# Patient Record
Sex: Female | Born: 1975 | Race: Black or African American | Hispanic: No | Marital: Single | Smoking: Current every day smoker
Health system: Southern US, Community
[De-identification: ages and names within clinical notes are randomized; demographics above are authoritative.]

## PROBLEM LIST (undated history)

## (undated) DIAGNOSIS — F419 Anxiety disorder, unspecified: Secondary | ICD-10-CM

## (undated) DIAGNOSIS — F32A Depression, unspecified: Secondary | ICD-10-CM

## (undated) DIAGNOSIS — F329 Major depressive disorder, single episode, unspecified: Secondary | ICD-10-CM

## (undated) HISTORY — PX: TUBAL LIGATION: SHX77

---

## 2001-01-27 ENCOUNTER — Encounter: Payer: Self-pay | Admitting: Emergency Medicine

## 2001-01-27 ENCOUNTER — Emergency Department (HOSPITAL_COMMUNITY): Admission: EM | Admit: 2001-01-27 | Discharge: 2001-01-27 | Payer: Self-pay | Admitting: Emergency Medicine

## 2002-12-09 ENCOUNTER — Emergency Department (HOSPITAL_COMMUNITY): Admission: EM | Admit: 2002-12-09 | Discharge: 2002-12-09 | Payer: Self-pay | Admitting: Emergency Medicine

## 2004-04-03 ENCOUNTER — Emergency Department (HOSPITAL_COMMUNITY): Admission: EM | Admit: 2004-04-03 | Discharge: 2004-04-03 | Payer: Self-pay | Admitting: Emergency Medicine

## 2004-05-19 ENCOUNTER — Emergency Department (HOSPITAL_COMMUNITY): Admission: EM | Admit: 2004-05-19 | Discharge: 2004-05-19 | Payer: Self-pay | Admitting: Emergency Medicine

## 2011-11-17 ENCOUNTER — Encounter (HOSPITAL_COMMUNITY): Payer: Self-pay | Admitting: *Deleted

## 2011-11-17 ENCOUNTER — Emergency Department (HOSPITAL_COMMUNITY)
Admission: EM | Admit: 2011-11-17 | Discharge: 2011-11-18 | Disposition: A | Discharge status: Home / Self Care | Payer: Self-pay | Attending: Emergency Medicine | Admitting: Emergency Medicine

## 2011-11-17 DIAGNOSIS — X58XXXA Exposure to other specified factors, initial encounter: Secondary | ICD-10-CM | POA: Insufficient documentation

## 2011-11-17 DIAGNOSIS — Y929 Unspecified place or not applicable: Secondary | ICD-10-CM | POA: Insufficient documentation

## 2011-11-17 DIAGNOSIS — S0502XA Injury of conjunctiva and corneal abrasion without foreign body, left eye, initial encounter: Secondary | ICD-10-CM

## 2011-11-17 DIAGNOSIS — F17200 Nicotine dependence, unspecified, uncomplicated: Secondary | ICD-10-CM | POA: Insufficient documentation

## 2011-11-17 DIAGNOSIS — Y939 Activity, unspecified: Secondary | ICD-10-CM | POA: Insufficient documentation

## 2011-11-17 DIAGNOSIS — S058X9A Other injuries of unspecified eye and orbit, initial encounter: Secondary | ICD-10-CM | POA: Insufficient documentation

## 2011-11-17 NOTE — ED Provider Notes (Signed)
History     CSN: [id number]  Arrival date & time 11/17/11  2248   First MD Initiated Contact with Patient 11/17/11 2323      Chief Complaint  Patient presents with  . Eye Pain    (Consider location/radiation/quality/duration/timing/severity/associated sxs/prior treatment) HPI  Amber Bright is a 36 y.o. female complaining of right eye burning sensation occurred approximately an hour ago. She woke up. Pain did not wake her from sleep. Denies photophobia, change in vision, contact lens use or discharge. Patient does not recall any incident that would cause trauma to the eye.   History reviewed. No pertinent past medical history.  Past Surgical History  Procedure Date  . Tubal ligation     No family history on file.  History  Substance Use Topics  . Smoking status: Current Every Day Smoker  . Smokeless tobacco: Never Used  . Alcohol Use: Yes    OB History    Grav Para Term Preterm Abortions TAB SAB Ect Mult Living                  Review of Systems  Constitutional: Negative for fever.  Eyes: Positive for redness. Negative for photophobia, discharge and visual disturbance.  Respiratory: Negative for shortness of breath.   Cardiovascular: Negative for chest pain.  Gastrointestinal: Negative for nausea, vomiting, abdominal pain and diarrhea.  All other systems reviewed and are negative.    Allergies  Review of patient's allergies indicates no known allergies.  Home Medications  No current outpatient prescriptions on file.  BP 136/89  Pulse 98  Temp 98.9 F (37.2 C) (Oral)  Resp 16  Ht 5' 5.5" (1.664 m)  Wt 183 lb (83.008 kg)  BMI 29.99 kg/m2  SpO2 100%  LMP 11/02/2011  Physical Exam  Nursing note and vitals reviewed. Constitutional: She is oriented to person, place, and time. She appears well-developed and well-nourished. No distress.  HENT:  Head: Normocephalic.  Eyes: Conjunctivae normal and EOM are normal. Pupils are equal, round, and reactive  to light.         Mildly injected bulbar conjunctiva greater on right than on the left.  No foreign body on lid eversion.  Fluorescein stain reveals several small corneal abrasions.  Cardiovascular: Normal rate.   Pulmonary/Chest: Effort normal. No stridor.  Musculoskeletal: Normal range of motion.  Neurological: She is alert and oriented to person, place, and time.  Psychiatric: She has a normal mood and affect.    ED Course  Procedures (including critical care time)  Labs Reviewed - No data to display No results found.   1. Corneal abrasion, left       MDM  2 small corneal abrasions. Patient will be given Cipro ophthalmic ointment and ophthalmology follow-up.  Visual acuity is 20/40 bilaterally   Pt verbalized understanding and agrees with care plan. Outpatient follow-up and return precautions given.    New Prescriptions   CIPROFLOXACIN (CILOXAN) 0.3 % OPHTHALMIC OINTMENT    1-2 drops in affected eye every 2 hours while awake for 2 days then every 4 hours while awake for 5 days.   TRAMADOL (ULTRAM) 50 MG TABLET    Take 1 tablet (50 mg total) by mouth every 6 (six) hours as needed for pain.          Wynetta Emery, PA-C 11/18/11 0108  Wynetta Emery, PA-C 11/18/11 4098

## 2011-11-17 NOTE — ED Notes (Signed)
Pt from home with c/o right eye pain- sts she awoke from sleeping and felt like her eye was scratched on the inside.

## 2011-11-18 MED ORDER — CIPROFLOXACIN HCL 0.3 % OP OINT: TOPICAL_OINTMENT | OPHTHALMIC | Status: DC

## 2011-11-18 MED ORDER — TRAMADOL HCL 50 MG PO TABS: 50.0000 mg | ORAL_TABLET | Freq: Four times a day (QID) | ORAL | Status: DC | PRN

## 2011-11-18 NOTE — ED Provider Notes (Signed)
Medical screening examination/treatment/procedure(s) were performed by non-physician practitioner and as supervising physician I was immediately available for consultation/collaboration.   Celene Kras, MD 11/18/11 [phone]

## 2011-11-18 NOTE — ED Notes (Signed)
Patient given discharge instructions, information, prescriptions, and diet order. Patient states that they adequately understand discharge information given and to return to ED if symptoms return or worsen.     

## 2011-12-19 ENCOUNTER — Encounter (HOSPITAL_COMMUNITY): Payer: Self-pay | Admitting: *Deleted

## 2011-12-19 ENCOUNTER — Emergency Department (HOSPITAL_COMMUNITY)
Admission: EM | Admit: 2011-12-19 | Discharge: 2011-12-19 | Disposition: A | Discharge status: Home / Self Care | Payer: Self-pay | Attending: Emergency Medicine | Admitting: Emergency Medicine

## 2011-12-19 DIAGNOSIS — K0889 Other specified disorders of teeth and supporting structures: Secondary | ICD-10-CM

## 2011-12-19 DIAGNOSIS — F17200 Nicotine dependence, unspecified, uncomplicated: Secondary | ICD-10-CM | POA: Insufficient documentation

## 2011-12-19 DIAGNOSIS — K089 Disorder of teeth and supporting structures, unspecified: Secondary | ICD-10-CM | POA: Insufficient documentation

## 2011-12-19 MED ORDER — HYDROCODONE-ACETAMINOPHEN 5-325 MG PO TABS
2.0000 | ORAL_TABLET | Freq: Once | ORAL | Status: AC
  Administered 2011-12-19: 2 via ORAL
  Filled 2011-12-19: qty 2

## 2011-12-19 MED ORDER — AMOXICILLIN 500 MG PO CAPS: 500.0000 mg | ORAL_CAPSULE | Freq: Three times a day (TID) | ORAL | Status: DC

## 2011-12-19 MED ORDER — HYDROCODONE-ACETAMINOPHEN 5-325 MG PO TABS: 2.0000 | ORAL_TABLET | ORAL | Status: DC | PRN

## 2011-12-19 NOTE — ED Provider Notes (Signed)
History     CSN: [id number]  Arrival date & time 12/19/11  1515   First MD Initiated Contact with Patient 12/19/11 1615      Chief Complaint  Patient presents with  . Dental Pain    (Consider location/radiation/quality/duration/timing/severity/associated sxs/prior treatment) Patient is a 36 y.o. female presenting with tooth pain. The history is provided by the patient. No language interpreter was used.  Dental PainThe primary symptoms include mouth pain. The symptoms began more than 1 month ago. The symptoms are worsening. The symptoms are new. The symptoms occur constantly.  Mouth pain began more than 1 month ago. Mouth pain occurs constantly. Mouth pain is worsening. Affected locations include: gum(s) and teeth. At its highest the mouth pain was at 10/10. The mouth pain is currently at 10/10.  Additional symptoms include: gum swelling and gum tenderness. Medical issues do not include: alcohol problem.   Pt complains of pain to right upper molar.   Pt reports no relief with otc medications History reviewed. No pertinent past medical history.  Past Surgical History  Procedure Date  . Tubal ligation     History reviewed. No pertinent family history.  History  Substance Use Topics  . Smoking status: Current Every Day Smoker  . Smokeless tobacco: Never Used  . Alcohol Use: Yes    OB History    Grav Para Term Preterm Abortions TAB SAB Ect Mult Living                  Review of Systems  HENT: Positive for dental problem.   All other systems reviewed and are negative.    Allergies  Review of patient's allergies indicates no known allergies.  Home Medications   Current Outpatient Rx  Name  Route  Sig  Dispense  Refill  . IBUPROFEN 200 MG PO TABS   Oral   Take 800 mg by mouth every 6 (six) hours as needed. For pain         . TRAMADOL HCL 50 MG PO TABS   Oral   Take 1 tablet (50 mg total) by mouth every 6 (six) hours as needed for pain.   10 tablet   0      BP 126/92  Pulse 101  Temp 98.3 F (36.8 C) (Oral)  Resp 14  SpO2 99%  Physical Exam  Nursing note and vitals reviewed. Constitutional: She is oriented to person, place, and time. She appears well-developed and well-nourished.  HENT:  Head: Normocephalic and atraumatic.  Right Ear: External ear normal.  Left Ear: External ear normal.  Nose: Nose normal.  Mouth/Throat: Oropharynx is clear and moist.  Eyes: Pupils are equal, round, and reactive to light.  Neck: Normal range of motion.  Cardiovascular: Normal rate and normal heart sounds.   Pulmonary/Chest: Effort normal and breath sounds normal.  Musculoskeletal: Normal range of motion.  Neurological: She is alert and oriented to person, place, and time.  Skin: Skin is warm.  Psychiatric: She has a normal mood and affect.    ED Course  Procedures (including critical care time)  Labs Reviewed - No data to display No results found.   1. Toothache       MDM  Pt given rx for amoxicillian and hydrocodone.   Pt given number for Dr. Oswaldo Done dentistry.        Lonia Skinner [place], [place] 12/19/11 1625  Lonia Skinner [place], [place] 12/19/11 [phone]

## 2011-12-19 NOTE — ED Notes (Signed)
Patient has 10/10 right upper tooth pain. Patient has been treating at home with tylenol, oral jel, NSaids, Tramadol, clove oil with minor relief. Today it became more intense. Patient does not have a dentist or primary care physician.

## 2011-12-19 NOTE — ED Provider Notes (Signed)
Medical screening examination/treatment/procedure(s) were performed by non-physician practitioner and as supervising physician I was immediately available for consultation/collaboration.  Doug Sou, MD 12/19/11 1904

## 2011-12-19 NOTE — ED Notes (Signed)
To ED for eval of right upper tooth pain since yesterday. States she has used OTC meds without relief.

## 2012-03-30 ENCOUNTER — Encounter (HOSPITAL_COMMUNITY): Payer: Self-pay | Admitting: Emergency Medicine

## 2012-03-30 ENCOUNTER — Emergency Department (HOSPITAL_COMMUNITY)
Admission: EM | Admit: 2012-03-30 | Discharge: 2012-03-30 | Disposition: A | Discharge status: Home / Self Care | Payer: Self-pay | Attending: Emergency Medicine | Admitting: Emergency Medicine

## 2012-03-30 DIAGNOSIS — Z3202 Encounter for pregnancy test, result negative: Secondary | ICD-10-CM | POA: Insufficient documentation

## 2012-03-30 DIAGNOSIS — Z9851 Tubal ligation status: Secondary | ICD-10-CM | POA: Insufficient documentation

## 2012-03-30 DIAGNOSIS — R1110 Vomiting, unspecified: Secondary | ICD-10-CM | POA: Insufficient documentation

## 2012-03-30 DIAGNOSIS — E669 Obesity, unspecified: Secondary | ICD-10-CM | POA: Insufficient documentation

## 2012-03-30 DIAGNOSIS — M545 Low back pain, unspecified: Secondary | ICD-10-CM | POA: Insufficient documentation

## 2012-03-30 DIAGNOSIS — F17200 Nicotine dependence, unspecified, uncomplicated: Secondary | ICD-10-CM | POA: Insufficient documentation

## 2012-03-30 DIAGNOSIS — R109 Unspecified abdominal pain: Secondary | ICD-10-CM | POA: Insufficient documentation

## 2012-03-30 LAB — COMPREHENSIVE METABOLIC PANEL
ALT: 12 U/L (ref 0–35)
AST: 16 U/L (ref 0–37)
Calcium: 8.9 mg/dL (ref 8.4–10.5)
Sodium: 133 mEq/L — ABNORMAL LOW (ref 135–145)
Total Protein: 7.7 g/dL (ref 6.0–8.3)

## 2012-03-30 LAB — URINALYSIS, ROUTINE W REFLEX MICROSCOPIC
Glucose, UA: NEGATIVE mg/dL
Hgb urine dipstick: NEGATIVE
Protein, ur: NEGATIVE mg/dL

## 2012-03-30 LAB — CBC WITH DIFFERENTIAL/PLATELET
Basophils Relative: 0 % (ref 0–1)
Eosinophils Absolute: 0.1 10*3/uL (ref 0.0–0.7)
HCT: 43 % (ref 36.0–46.0)
Hemoglobin: 14.9 g/dL (ref 12.0–15.0)
MCH: 32.3 pg (ref 26.0–34.0)
MCHC: 34.7 g/dL (ref 30.0–36.0)
Monocytes Absolute: 0.5 10*3/uL (ref 0.1–1.0)
Monocytes Relative: 6 % (ref 3–12)
Neutro Abs: 8.2 10*3/uL — ABNORMAL HIGH (ref 1.7–7.7)

## 2012-03-30 LAB — PREGNANCY, URINE: Preg Test, Ur: NEGATIVE

## 2012-03-30 MED ORDER — MORPHINE SULFATE 4 MG/ML IJ SOLN
4.0000 mg | INTRAMUSCULAR | Status: DC | PRN
  Administered 2012-03-30: 4 mg via INTRAVENOUS
  Filled 2012-03-30: qty 1

## 2012-03-30 MED ORDER — FENTANYL CITRATE 0.05 MG/ML IJ SOLN
50.0000 ug | Freq: Once | INTRAMUSCULAR | Status: AC
  Administered 2012-03-30: 50 ug via INTRAVENOUS
  Filled 2012-03-30: qty 2

## 2012-03-30 MED ORDER — ONDANSETRON 8 MG PO TBDP
8.0000 mg | ORAL_TABLET | Freq: Once | ORAL | Status: AC
  Administered 2012-03-30: 8 mg via ORAL
  Filled 2012-03-30: qty 1

## 2012-03-30 MED ORDER — DICYCLOMINE HCL 10 MG/ML IM SOLN
20.0000 mg | Freq: Once | INTRAMUSCULAR | Status: AC
  Administered 2012-03-30: 20 mg via INTRAMUSCULAR
  Filled 2012-03-30: qty 2

## 2012-03-30 MED ORDER — FAMOTIDINE IN NACL 20-0.9 MG/50ML-% IV SOLN
20.0000 mg | Freq: Once | INTRAVENOUS | Status: AC
  Administered 2012-03-30: 20 mg via INTRAVENOUS
  Filled 2012-03-30: qty 50

## 2012-03-30 MED ORDER — PROMETHAZINE HCL 25 MG PO TABS: 25.0000 mg | ORAL_TABLET | Freq: Four times a day (QID) | ORAL | Status: DC | PRN

## 2012-03-30 MED ORDER — ONDANSETRON HCL 4 MG/2ML IJ SOLN
4.0000 mg | Freq: Once | INTRAMUSCULAR | Status: AC
  Administered 2012-03-30: 4 mg via INTRAVENOUS
  Filled 2012-03-30: qty 2

## 2012-03-30 MED ORDER — TRAMADOL HCL 50 MG PO TABS: 50.0000 mg | ORAL_TABLET | Freq: Four times a day (QID) | ORAL | Status: DC | PRN

## 2012-03-30 MED ORDER — OXYCODONE-ACETAMINOPHEN 5-325 MG PO TABS: ORAL_TABLET | ORAL | Status: DC

## 2012-03-30 NOTE — ED Notes (Signed)
Pt c/o vomiting x 6 hours, abd cramping, HA. Denies diarrhea.

## 2012-03-30 NOTE — ED Provider Notes (Signed)
History     CSN: [id number]  Arrival date & time 03/30/12  0155   First MD Initiated Contact with Patient 03/30/12 [phone]      Chief Complaint  Patient presents with  . Emesis    (Consider location/radiation/quality/duration/timing/severity/associated sxs/prior treatment) HPI   Amber Bright is a 37 y.o. female with multiple episodes of nonbloody nonbilious, non-coffee ground emesis since 8 PM last night. Patient reports a moderate abdominal and low back pain that began after the emesis. She denies fever, diarrhea, sick contacts, cough, shortness of breath or chest pain.  History reviewed. No pertinent past medical history.  Past Surgical History  Procedure Laterality Date  . Tubal ligation      No family history on file.  History  Substance Use Topics  . Smoking status: Current Every Day Smoker  . Smokeless tobacco: Never Used  . Alcohol Use: Yes     Comment: occ    OB History   Grav Para Term Preterm Abortions TAB SAB Ect Mult Living                  Review of Systems  Constitutional: Negative for fever.  Respiratory: Negative for shortness of breath.   Cardiovascular: Negative for chest pain.  Gastrointestinal: Positive for nausea, vomiting and abdominal pain. Negative for diarrhea.  Musculoskeletal: Positive for back pain.  All other systems reviewed and are negative.    Allergies  Hydrocodone-acetaminophen  Home Medications   Current Outpatient Rx  Name  Route  Sig  Dispense  Refill  . diphenhydramine-acetaminophen (TYLENOL PM) 25-500 MG TABS   Oral   Take 1-2 tablets by mouth at bedtime as needed (for pain).         Marland Kitchen docusate sodium (COLACE) 100 MG capsule   Oral   Take 100 mg by mouth 2 (two) times daily as needed for constipation.         . senna (SENOKOT) 8.6 MG TABS   Oral   Take 1 tablet by mouth daily as needed (for constipation).           BP 140/94  Pulse 89  Temp(Src) 99.1 F (37.3 C) (Oral)  Resp 18  Ht 5\' 5"  (1.651  m)  Wt 170 lb (77.111 kg)  BMI 28.29 kg/m2  SpO2 100%  LMP 03/17/2012  Physical Exam  Nursing note and vitals reviewed. Constitutional: She is oriented to person, place, and time. She appears well-developed and well-nourished. No distress.  Obese  HENT:  Head: Normocephalic.  Mouth/Throat: Oropharynx is clear and moist.  MMM  Eyes: Conjunctivae and EOM are normal. Pupils are equal, round, and reactive to light.  Neck: Normal range of motion.  Cardiovascular: Normal rate, regular rhythm and intact distal pulses.   Pulmonary/Chest: Effort normal and breath sounds normal. No stridor. No respiratory distress. She has no wheezes. She has no rales. She exhibits no tenderness.  Abdominal: Soft. Bowel sounds are normal. She exhibits no distension and no mass. There is no tenderness. There is no rebound and no guarding.  Musculoskeletal: Normal range of motion.  A tenderness to palpation of low back  Neurological: She is alert and oriented to person, place, and time.  Psychiatric: She has a normal mood and affect.    ED Course  Procedures (including critical care time)  Labs Reviewed  COMPREHENSIVE METABOLIC PANEL - Abnormal; Notable for the following:    Sodium 133 (*)    Glucose, Bld 141 (*)    All other components within  normal limits  CBC WITH DIFFERENTIAL - Abnormal; Notable for the following:    Neutrophils Relative 86 (*)    Neutro Abs 8.2 (*)    Lymphocytes Relative 8 (*)    All other components within normal limits  URINALYSIS, ROUTINE W REFLEX MICROSCOPIC - Abnormal; Notable for the following:    Color, Urine AMBER (*)    APPearance CLOUDY (*)    Specific Gravity, Urine 1.034 (*)    All other components within normal limits  LIPASE, BLOOD  PREGNANCY, URINE   No results found.   1. Emesis       MDM   Amber Bright is a 37 y.o. female with multiple episodes of nausea and vomiting. Blood work is unremarkable and abdominal exam is nonsurgical. Patient passed by  mouth challenge. Pain control given. Return precautions explained with patient voicing her understanding.   Filed Vitals:   03/30/12 0306 03/30/12 0807 03/30/12 0957  BP: 132/84 140/94 141/85  Pulse: 99 89 86  Temp: 98.7 F (37.1 C) 99.1 F (37.3 C) 99 F (37.2 C)  TempSrc: Oral Oral Oral  Resp: 18 18 18   Height: 5\' 5"  (1.651 m)    Weight: 170 lb (77.111 kg)    SpO2: 98% 100% 98%     Pt verbalized understanding and agrees with care plan. Outpatient follow-up and return precautions given.    Discharge Medication List as of 03/30/2012  9:44 AM    START taking these medications   Details  promethazine (PHENERGAN) 25 MG tablet Take 1 tablet (25 mg total) by mouth every 6 (six) hours as needed for nausea., Starting 03/30/2012, Until Discontinued, Print    traMADol (ULTRAM) 50 MG tablet Take 1 tablet (50 mg total) by mouth every 6 (six) hours as needed for pain., Starting 03/30/2012, Until Discontinued, [employer], PA-C 03/30/12 1642

## 2012-03-31 NOTE — ED Provider Notes (Signed)
Medical screening examination/treatment/procedure(s) were performed by non-physician practitioner and as supervising physician I was immediately available for consultation/collaboration.  Kejon Feild T Romey Mathieson, MD 03/31/12 2031 

## 2012-07-15 ENCOUNTER — Encounter (HOSPITAL_COMMUNITY): Payer: Self-pay | Admitting: *Deleted

## 2012-07-15 ENCOUNTER — Emergency Department (HOSPITAL_COMMUNITY): Payer: Self-pay

## 2012-07-15 ENCOUNTER — Emergency Department (HOSPITAL_COMMUNITY)
Admission: EM | Admit: 2012-07-15 | Discharge: 2012-07-15 | Disposition: A | Discharge status: Home / Self Care | Payer: Self-pay | Attending: Emergency Medicine | Admitting: Emergency Medicine

## 2012-07-15 DIAGNOSIS — F17200 Nicotine dependence, unspecified, uncomplicated: Secondary | ICD-10-CM | POA: Insufficient documentation

## 2012-07-15 DIAGNOSIS — Z79899 Other long term (current) drug therapy: Secondary | ICD-10-CM | POA: Insufficient documentation

## 2012-07-15 DIAGNOSIS — X500XXA Overexertion from strenuous movement or load, initial encounter: Secondary | ICD-10-CM | POA: Insufficient documentation

## 2012-07-15 DIAGNOSIS — S8392XA Sprain of unspecified site of left knee, initial encounter: Secondary | ICD-10-CM

## 2012-07-15 DIAGNOSIS — IMO0002 Reserved for concepts with insufficient information to code with codable children: Secondary | ICD-10-CM | POA: Insufficient documentation

## 2012-07-15 DIAGNOSIS — Y9301 Activity, walking, marching and hiking: Secondary | ICD-10-CM | POA: Insufficient documentation

## 2012-07-15 DIAGNOSIS — Y9289 Other specified places as the place of occurrence of the external cause: Secondary | ICD-10-CM | POA: Insufficient documentation

## 2012-07-15 MED ORDER — OXYCODONE-ACETAMINOPHEN 5-325 MG PO TABS: 1.0000 | ORAL_TABLET | ORAL | Status: DC | PRN

## 2012-07-15 MED ORDER — OXYCODONE-ACETAMINOPHEN 5-325 MG PO TABS
1.0000 | ORAL_TABLET | Freq: Once | ORAL | Status: AC
  Administered 2012-07-15: 1 via ORAL
  Filled 2012-07-15: qty 1

## 2012-07-15 NOTE — ED Notes (Signed)
Pt states twisted left knee tonight when stepping off porch; pain with ambulation

## 2012-07-15 NOTE — ED Provider Notes (Signed)
Medical screening examination/treatment/procedure(s) were performed by non-physician practitioner and as supervising physician I was immediately available for consultation/collaboration.  Teddy Rebstock M Jekhi Bolin, MD 07/15/12 0628 

## 2012-07-15 NOTE — ED Provider Notes (Signed)
History    CSN: [id number] Arrival date & time 07/15/12  0327  First MD Initiated Contact with Patient 07/15/12 0503     Chief Complaint  Patient presents with  . Knee Pain   (Consider location/radiation/quality/duration/timing/severity/associated sxs/prior Treatment) HPI History provided by pt.   Pt stepped down wrong when coming down the stairs this morning and her L knee twisted and popped. She has had severe pain ever since and is unable to bear weight.  Aggravated by flexion and no relief w/ ibuprofen or aleve. No associated paresthesias.   History reviewed. No pertinent past medical history. Past Surgical History  Procedure Laterality Date  . Tubal ligation     No family history on file. History  Substance Use Topics  . Smoking status: Current Every Day Smoker  . Smokeless tobacco: Never Used  . Alcohol Use: Yes     Comment: occ   OB History   Grav Para Term Preterm Abortions TAB SAB Ect Mult Living                 Review of Systems  All other systems reviewed and are negative.    Allergies  Hydrocodone-acetaminophen  Home Medications   Current Outpatient Rx  Name  Route  Sig  Dispense  Refill  . ibuprofen (ADVIL,MOTRIN) 200 MG tablet   Oral   Take 200 mg by mouth every 6 (six) hours as needed for pain (pain and swelling).         . ranitidine (ZANTAC) 150 MG tablet   Oral   Take 150 mg by mouth 2 (two) times daily.         Marland Kitchen docusate sodium (COLACE) 100 MG capsule   Oral   Take 100 mg by mouth 2 (two) times daily as needed for constipation.         Marland Kitchen oxyCODONE-acetaminophen (PERCOCET/ROXICET) 5-325 MG per tablet   Oral   Take 1 tablet by mouth every 4 (four) hours as needed for pain.   12 tablet   0    BP 137/98  Pulse 85  Temp(Src) 98.4 F (36.9 C) (Oral)  Resp 20  SpO2 99%  LMP 07/09/2012 Physical Exam  Nursing note and vitals reviewed. Constitutional: She is oriented to person, place, and time. She appears well-developed and  well-nourished. No distress.  HENT:  Head: Normocephalic and atraumatic.  Eyes:  Normal appearance  Neck: Normal range of motion.  Pulmonary/Chest: Effort normal.  Musculoskeletal: Normal range of motion.  L knee w/out deformity, ecchymosis or obvious edema.  Diffuse tenderness.  Pain w/ passive flexion.  No pain in knee w/ ROM of ankle.  2+ DP pulse and distal sensation intact.    Neurological: She is alert and oriented to person, place, and time.  Psychiatric: She has a normal mood and affect. Her behavior is normal.    ED Course  Procedures (including critical care time) Labs Reviewed - No data to display Dg Knee Complete 4 Views Left  07/15/2012   *RADIOLOGY REPORT*  Clinical Data: Twisting injury.  Pain, swelling.  LEFT KNEE - COMPLETE 4+ VIEW  Comparison: None.  Findings: No acute bony abnormality.  Specifically, no fracture, subluxation, or dislocation.  Soft tissues are intact. Joint spaces are maintained.  Normal bone mineralization.  No joint effusion.  IMPRESSION: Negative.   Original Report Authenticated By: Charlett Nose, M.D.   1. Sprain of left knee, initial encounter     MDM  [phone] F presents w/ L knee injury.  Xray neg for fx/dislocation and no NV deficits on exam.  Nursing staff provided w/ knee immobilizer and crutches.  Pt received one percocet in ED and d/c'd home w/ 12 more.  She is allergic to vicodin.  Recommended NSAID and RICE and referred to ortho for persistent/worsening sx.    Otilio Miu, PA-C 07/15/12 [phone]

## 2012-12-22 ENCOUNTER — Emergency Department (HOSPITAL_COMMUNITY)
Admission: EM | Admit: 2012-12-22 | Discharge: 2012-12-22 | Disposition: A | Discharge status: Home / Self Care | Payer: Self-pay | Attending: Emergency Medicine | Admitting: Emergency Medicine

## 2012-12-22 ENCOUNTER — Emergency Department (HOSPITAL_COMMUNITY): Payer: Self-pay

## 2012-12-22 ENCOUNTER — Encounter (HOSPITAL_COMMUNITY): Payer: Self-pay | Admitting: Emergency Medicine

## 2012-12-22 DIAGNOSIS — F17200 Nicotine dependence, unspecified, uncomplicated: Secondary | ICD-10-CM | POA: Insufficient documentation

## 2012-12-22 DIAGNOSIS — J209 Acute bronchitis, unspecified: Secondary | ICD-10-CM | POA: Insufficient documentation

## 2012-12-22 DIAGNOSIS — Z79899 Other long term (current) drug therapy: Secondary | ICD-10-CM | POA: Insufficient documentation

## 2012-12-22 DIAGNOSIS — R51 Headache: Secondary | ICD-10-CM | POA: Insufficient documentation

## 2012-12-22 DIAGNOSIS — IMO0001 Reserved for inherently not codable concepts without codable children: Secondary | ICD-10-CM | POA: Insufficient documentation

## 2012-12-22 DIAGNOSIS — J40 Bronchitis, not specified as acute or chronic: Secondary | ICD-10-CM

## 2012-12-22 LAB — BASIC METABOLIC PANEL
BUN: 17 mg/dL (ref 6–23)
CO2: 25 mEq/L (ref 19–32)
Chloride: 104 mEq/L (ref 96–112)
GFR calc non Af Amer: 80 mL/min — ABNORMAL LOW (ref >90)
Glucose, Bld: 98 mg/dL (ref 70–99)
Potassium: 3.3 mEq/L — ABNORMAL LOW (ref 3.5–5.1)

## 2012-12-22 LAB — CBC
HCT: 39 % (ref 36.0–46.0)
Hemoglobin: 13.8 g/dL (ref 12.0–15.0)
MCHC: 35.4 g/dL (ref 30.0–36.0)
Platelets: 360 10*3/uL (ref 150–400)
RBC: 4.26 MIL/uL (ref 3.87–5.11)

## 2012-12-22 LAB — POCT I-STAT TROPONIN I: Troponin i, poc: 0 ng/mL (ref 0.00–0.08)

## 2012-12-22 MED ORDER — AZITHROMYCIN 250 MG PO TABS: 250.0000 mg | ORAL_TABLET | Freq: Every day | ORAL | Status: DC

## 2012-12-22 MED ORDER — ALBUTEROL SULFATE HFA 108 (90 BASE) MCG/ACT IN AERS
2.0000 | INHALATION_SPRAY | RESPIRATORY_TRACT | Status: DC | PRN
  Administered 2012-12-22: 2 via RESPIRATORY_TRACT
  Filled 2012-12-22: qty 6.7

## 2012-12-22 MED ORDER — GUAIFENESIN-CODEINE 100-10 MG/5ML PO SOLN: 10.0000 mL | Freq: Once | ORAL | Status: DC

## 2012-12-22 MED ORDER — GUAIFENESIN 100 MG/5ML PO LIQD: 100.0000 mg | ORAL | Status: DC | PRN

## 2012-12-22 MED ORDER — AZITHROMYCIN 250 MG PO TABS
500.0000 mg | ORAL_TABLET | Freq: Once | ORAL | Status: AC
  Administered 2012-12-22: 500 mg via ORAL
  Filled 2012-12-22: qty 2

## 2012-12-22 MED ORDER — ALBUTEROL SULFATE (5 MG/ML) 0.5% IN NEBU
2.5000 mg | INHALATION_SOLUTION | Freq: Once | RESPIRATORY_TRACT | Status: AC
  Administered 2012-12-22: 2.5 mg via RESPIRATORY_TRACT
  Filled 2012-12-22: qty 0.5

## 2012-12-22 NOTE — ED Notes (Signed)
Patient reports a non productive cough, sore throat, wheezing, SOB x 2 weeks.

## 2012-12-22 NOTE — ED Provider Notes (Signed)
CSN: [id number]     Arrival date & time 12/22/12  1748 History   First MD Initiated Contact with Patient 12/22/12 2037     Chief Complaint  Patient presents with  . Cough  . Chest Pain   (Consider location/radiation/quality/duration/timing/severity/associated sxs/prior Treatment) Patient is a 37 y.o. female presenting with cough.  Cough Cough characteristics:  Hoarse Severity:  Moderate Onset quality:  Gradual Duration:  4 days Timing:  Intermittent Progression:  Unchanged Chronicity:  New Smoker: yes   Context: sick contacts and smoke exposure   Relieved by:  Nothing Worsened by:  Nothing tried Ineffective treatments:  None tried Associated symptoms: chest pain, headaches, myalgias and wheezing   Associated symptoms: no ear pain, no eye discharge, no fever, no shortness of breath, no sore throat and no weight loss   Risk factors: no recent infection and no recent travel     Amber Bright is a 37 y.o.female without any significant PMH presents to the ER with complaints of cough and cold for 4 days with loss of voice. She is normally healthy and has not had fevers. She states that she lost her voice and now when she coughs it hurts her shoulder, head, and chest. She feels that she is not sleeping because she is coughing and she would like her voice to come back.   History reviewed. No pertinent past medical history. Past Surgical History  Procedure Laterality Date  . Tubal ligation     History reviewed. No pertinent family history. History  Substance Use Topics  . Smoking status: Current Every Day Smoker -- 0.50 packs/day    Types: Cigarettes  . Smokeless tobacco: Never Used  . Alcohol Use: Yes     Comment: occ   OB History   Grav Para Term Preterm Abortions TAB SAB Ect Mult Living                 Review of Systems  Constitutional: Negative for fever and weight loss.  HENT: Negative for ear pain and sore throat.   Eyes: Negative for discharge.  Respiratory:  Positive for cough and wheezing. Negative for shortness of breath.   Cardiovascular: Positive for chest pain.  Musculoskeletal: Positive for myalgias.  Neurological: Positive for headaches.    Allergies  Hydrocodone-acetaminophen  Home Medications   Current Outpatient Rx  Name  Route  Sig  Dispense  Refill  . aspirin-sod bicarb-citric acid (ALKA-SELTZER) 325 MG TBEF tablet   Oral   Take 650 mg by mouth every 6 (six) hours as needed (for chest tightness).         Marland Kitchen ibuprofen (ADVIL,MOTRIN) 200 MG tablet   Oral   Take 200 mg by mouth every 6 (six) hours as needed for pain (pain and swelling).         . ranitidine (ZANTAC) 150 MG tablet   Oral   Take 150 mg by mouth 2 (two) times daily.         Marland Kitchen azithromycin (ZITHROMAX) 250 MG tablet   Oral   Take 1 tablet (250 mg total) by mouth daily. 1 tab PO q daily   4 tablet   0   . guaiFENesin (ROBITUSSIN) 100 MG/5ML liquid   Oral   Take 5-10 mLs (100-200 mg total) by mouth every 4 (four) hours as needed for cough.   60 mL   0    BP 149/92  Pulse 91  Temp(Src) 98.7 F (37.1 C) (Oral)  Resp 16  SpO2 100%  LMP  12/22/2012 Physical Exam  Nursing note and vitals reviewed. Constitutional: She appears well-developed and well-nourished. No distress.  HENT:  Head: Normocephalic and atraumatic.  Right Ear: Tympanic membrane normal.  Left Ear: Tympanic membrane normal.  Nose: Right sinus exhibits maxillary sinus tenderness and frontal sinus tenderness. Left sinus exhibits maxillary sinus tenderness and frontal sinus tenderness.  Mouth/Throat: Uvula is midline and oropharynx is clear and moist.  Eyes: Conjunctivae are normal. Pupils are equal, round, and reactive to light.  Neck: Normal range of motion. Neck supple.  Cardiovascular: Normal rate and regular rhythm.   Pulmonary/Chest: Effort normal. She has no decreased breath sounds. She has wheezes. She has no rhonchi.  Abdominal: Soft.  Neurological: She is alert.  Skin:  Skin is warm and dry.    ED Course  Procedures (including critical care time) Labs Review Labs Reviewed  CBC  PRO B NATRIURETIC PEPTIDE  BASIC METABOLIC PANEL  POCT I-STAT TROPONIN I   Imaging Review Dg Chest 2 View  12/22/2012   CLINICAL DATA:  Cough, chest pain  EXAM: CHEST  2 VIEW  COMPARISON:  None.  FINDINGS: Normal mediastinum and cardiac silhouette. Normal pulmonary vasculature. No evidence of effusion, infiltrate, or pneumothorax. No acute bony abnormality.  IMPRESSION: Normal chest radiograph   Electronically Signed   By: Genevive Bi M.D.   On: 12/22/2012 19:42    EKG Interpretation   None       MDM   1. Bronchitis     Patients labs are not impressive for any abnormalities. I do not believe that this is cardiac due to patients loss of voice and coughing.   Breathing treatment giving in triage which helped wheezing.  Rx: Azithromycin, Albuterol and cough medication.  Non toxic, pt comfortable with plan.  37 y.o.Amber Bright's evaluation in the Emergency Department is complete. It has been determined that no acute conditions requiring further emergency intervention are present at this time. The patient/guardian have been advised of the diagnosis and plan. We have discussed signs and symptoms that warrant return to the ED, such as changes or worsening in symptoms.  Vital signs are stable at discharge. Filed Vitals:   12/22/12 1911  BP: 149/92  Pulse: 91  Temp:   Resp: 16    Patient/guardian has voiced understanding and agreed to follow-up with the PCP or specialist.     Dorthula Matas, PA-C 12/22/12 2121

## 2012-12-22 NOTE — ED Notes (Signed)
Pt reports R shoulder pain and R side pain, only when coughing at this time.

## 2012-12-23 NOTE — ED Provider Notes (Signed)
Medical screening examination/treatment/procedure(s) were performed by non-physician practitioner and as supervising physician I was immediately available for consultation/collaboration.  EKG Interpretation   None        Courtney F Horton, MD 12/23/12 1635 

## 2013-05-05 ENCOUNTER — Emergency Department (HOSPITAL_COMMUNITY)
Admission: EM | Admit: 2013-05-05 | Discharge: 2013-05-05 | Disposition: A | Discharge status: Home / Self Care | Payer: No Typology Code available for payment source | Attending: Emergency Medicine | Admitting: Emergency Medicine

## 2013-05-05 DIAGNOSIS — L723 Sebaceous cyst: Secondary | ICD-10-CM | POA: Insufficient documentation

## 2013-05-05 DIAGNOSIS — F17200 Nicotine dependence, unspecified, uncomplicated: Secondary | ICD-10-CM | POA: Insufficient documentation

## 2013-05-05 DIAGNOSIS — Z79899 Other long term (current) drug therapy: Secondary | ICD-10-CM | POA: Insufficient documentation

## 2013-05-05 MED ORDER — OXYCODONE-ACETAMINOPHEN 5-325 MG PO TABS
1.0000 | ORAL_TABLET | Freq: Once | ORAL | Status: AC
  Administered 2013-05-05: 1 via ORAL
  Filled 2013-05-05: qty 1

## 2013-05-05 MED ORDER — OXYCODONE-ACETAMINOPHEN 5-325 MG PO TABS: 1.0000 | ORAL_TABLET | ORAL | Status: DC | PRN

## 2013-05-05 NOTE — Discharge Instructions (Signed)
Take percocet as prescribed for severe pain.   Do not drive within four hours of taking this medication (may cause drowsiness or confusion).  Follow up with general surgery for further treatment.  You should be seen right away if you develop fever, increased swelling or spreading of redness around wound.

## 2013-05-05 NOTE — ED Notes (Signed)
Pt states she has an abscess to L axilla area. Pt states it started yesterday. States area is hard and painful to touch. Pt has used hot compresses and Motrin, but it is not helping much.

## 2013-05-05 NOTE — ED Notes (Signed)
Pt has a ride home.  

## 2013-05-05 NOTE — ED Provider Notes (Signed)
CSN: [id number]     Arrival date & time 05/05/13  2052 History  This chart was scribed for non-physician practitioner working with Amber CamelScott T Goldston, MD by Elveria Risingimelie Horne, ED Scribe. This patient was seen in room WTR7/WTR7 and the patient's care was started at 9:34 PM.   Chief Complaint  Patient presents with  . Abscess      The history is provided by the patient. No language interpreter was used.   HPI Comments: Amber Bright is a 38 y.o. female who presents to the Emergency Department complaining of abscess to left axilla, onset yesterday. Patient says that she initially thought she had an ingrown hair so shaved her armpits. Around 3am this morning she says she noticed a sore bump. As the days progressed the area grew in size, hardened as has become more painful. She says the area is so painful that she can barely tolerate the touch of her clothes. Patient has attempted with hot compresses and ibuprofen, but reports no relief. Patient denies fever. Patient shares history of an abscess 10 years that resolved without treatment.   No past medical history on file. Past Surgical History  Procedure Laterality Date  . Tubal ligation     No family history on file. History  Substance Use Topics  . Smoking status: Current Every Day Smoker -- 0.50 packs/day    Types: Cigarettes  . Smokeless tobacco: Never Used  . Alcohol Use: Yes     Comment: occ   OB History   Grav Para Term Preterm Abortions TAB SAB Ect Mult Living                 Review of Systems  Constitutional: Negative for fever.  Skin:       Abscess      Allergies  Hydrocodone-acetaminophen  Home Medications   Prior to Admission medications   Medication Sig Start Date End Date Taking? Authorizing Provider  ibuprofen (ADVIL,MOTRIN) 200 MG tablet Take 200 mg by mouth every 6 (six) hours as needed for pain (pain and swelling).   Yes Historical Provider, MD  ranitidine (ZANTAC) 150 MG tablet Take 150 mg by mouth 2 (two)  times daily.   Yes Historical Provider, MD   Triage Vitals: BP 132/92  Pulse 94  Temp(Src) 98.4 F (36.9 C) (Oral)  Resp 16  SpO2 98% Physical Exam  Nursing note and vitals reviewed. Constitutional: She is oriented to person, place, and time. She appears well-developed and well-nourished. No distress.  HENT:  Head: Normocephalic and atraumatic.  Eyes:  Normal appearance  Neck: Normal range of motion.  Pulmonary/Chest: Effort normal.  Musculoskeletal: Normal range of motion.  Neurological: She is alert and oriented to person, place, and time.  Skin:  Induration and severe tenderness L axilla.  1cm area of erythema.  No fluctuance or drainage.   Psychiatric: She has a normal mood and affect. Her behavior is normal.    ED Course  Procedures (including critical care time) INCISION AND DRAINAGE PROCEDURE NOTE: Patient identification was confirmed and verbal consent was obtained. This procedure was performed by Amber CamelScott T Goldston, MD at 10:25 PM. Site: L axilla Sterile procedures observed  Needle size: 25 gauge  Anesthetic used (type and amt): lidocaine w/out epi, 5cc Blade size: 11 Drainage: large amt of thick, chunky white fluid Complexity: Complex Packing used (none) Site anesthetized, incision made over site, wound drained and explored loculations, rinsed with copious amounts of normal saline, wound packed with sterile gauze, covered with dry, sterile  dressing.  Pt tolerated procedure well without complications.  Instructions for care discussed verbally and pt provided with additional written instructions for homecare and f/u.  DIAGNOSTIC STUDIES: Oxygen Saturation is 98% on room air, normal by my interpretation.    COORDINATION OF CARE: 9:41 PM- Discussed treatment plan with patient at bedside and patient agreed to plan.        Labs Review Labs Reviewed - No data to display  Imaging Review No results found.   EKG Interpretation None      MDM   Final  diagnoses:  Sebaceous cyst of left axilla    [name].  Subq fluid collection visible on bedside US.  I&D'd.  Suspect infected sebaceous cyst.  [name].   Pain medication prescribed.  Return precautions discussed.  10:27 PM  I personally performed the services described in this documentation, which was scribed in my presence. The recorded information has been reviewed and is accurate.    [name], PA-C 05/05/13 2229

## 2013-05-07 ENCOUNTER — Emergency Department (HOSPITAL_COMMUNITY)
Admission: EM | Admit: 2013-05-07 | Discharge: 2013-05-07 | Disposition: A | Discharge status: Home / Self Care | Payer: No Typology Code available for payment source | Attending: Emergency Medicine | Admitting: Emergency Medicine

## 2013-05-07 ENCOUNTER — Encounter (HOSPITAL_COMMUNITY): Payer: Self-pay | Admitting: Emergency Medicine

## 2013-05-07 DIAGNOSIS — IMO0002 Reserved for concepts with insufficient information to code with codable children: Secondary | ICD-10-CM | POA: Insufficient documentation

## 2013-05-07 DIAGNOSIS — Z79899 Other long term (current) drug therapy: Secondary | ICD-10-CM | POA: Insufficient documentation

## 2013-05-07 DIAGNOSIS — F17200 Nicotine dependence, unspecified, uncomplicated: Secondary | ICD-10-CM | POA: Insufficient documentation

## 2013-05-07 DIAGNOSIS — L0291 Cutaneous abscess, unspecified: Secondary | ICD-10-CM

## 2013-05-07 MED ORDER — SULFAMETHOXAZOLE-TMP DS 800-160 MG PO TABS: 1.0000 | ORAL_TABLET | Freq: Two times a day (BID) | ORAL | Status: DC

## 2013-05-07 MED ORDER — KETOROLAC TROMETHAMINE 30 MG/ML IJ SOLN
30.0000 mg | Freq: Once | INTRAMUSCULAR | Status: AC
  Administered 2013-05-07: 30 mg via INTRAMUSCULAR
  Filled 2013-05-07: qty 1

## 2013-05-07 MED ORDER — OXYCODONE-ACETAMINOPHEN 5-325 MG PO TABS
1.0000 | ORAL_TABLET | Freq: Once | ORAL | Status: AC
  Administered 2013-05-07: 1 via ORAL
  Filled 2013-05-07: qty 1

## 2013-05-07 MED ORDER — ONDANSETRON 4 MG PO TBDP
4.0000 mg | ORAL_TABLET | Freq: Once | ORAL | Status: AC
  Administered 2013-05-07: 4 mg via ORAL
  Filled 2013-05-07: qty 1

## 2013-05-07 MED ORDER — SULFAMETHOXAZOLE-TMP DS 800-160 MG PO TABS
1.0000 | ORAL_TABLET | Freq: Once | ORAL | Status: AC
  Administered 2013-05-07: 1 via ORAL
  Filled 2013-05-07: qty 1

## 2013-05-07 NOTE — ED Notes (Addendum)
Pt reports being seen on 05/05/2013 for an abscess to the left axilla, which was treated with an I&D. Pt reports that [facility], however [facility]. Pt states the pain has not been relieved and the area is firm and the pain worsens with movement. Pt is A/O x4, in NAD, and vitals are WDL.

## 2013-05-07 NOTE — ED Provider Notes (Signed)
CSN: [id number]     Arrival date & time 05/07/13  1941 History  This chart was scribed for non-physician practitioner Arman FilterGail K Zacariah Belue, NP working with No att. providers found by Donne Anonayla Curran, ED Scribe. This patient was seen in room WTR6/WTR6 and the patient's care was started at 8:25 PM.    Chief Complaint  Patient presents with  . Abscess    The history is provided by the patient. No language interpreter was used.   HPI Comments: Amber Bright is a 38 y.o. female who presents to the Emergency Department complaining of an abscess to her left axilla. She was seen in the ED 2 days ago for the same complaint and was treated with an I&D, but her pain and symptoms have persisted. She states the pain is so severe it makes her arm numb and "tingly." She has not tried warm compresses. She tried taking one of the Percocet's she was prescribed, but reports it made her vomit so she has not taken any more. She denies taking OTC pain medication. She denies fever, redness or swelling to the area, drainage from the area, or any other symptoms. She states [facility] did not accept her referral from the ED, so she has made an appointment with her PCP for a referral. She is seeking pain management.   History reviewed. No pertinent past medical history. Past Surgical History  Procedure Laterality Date  . Tubal ligation     No family history on file. History  Substance Use Topics  . Smoking status: Current Every Day Smoker -- 0.50 packs/day    Types: Cigarettes  . Smokeless tobacco: Never Used  . Alcohol Use: Yes     Comment: occ   OB History   Grav Para Term Preterm Abortions TAB SAB Ect Mult Living                 Review of Systems  Constitutional: Negative for fever and chills.  Musculoskeletal: Negative for joint swelling.  Skin: Positive for wound.  Neurological: Negative for numbness.  All other systems reviewed and are negative.     Allergies   Hydrocodone-acetaminophen  Home Medications   Prior to Admission medications   Medication Sig Start Date End Date Taking? Authorizing Provider  ibuprofen (ADVIL,MOTRIN) 200 MG tablet Take 200 mg by mouth every 6 (six) hours as needed for pain (pain and swelling).    Historical Provider, MD  oxyCODONE-acetaminophen (PERCOCET/ROXICET) 5-325 MG per tablet Take 1 tablet by mouth every 4 (four) hours as needed for severe pain. 05/05/13   Arie Sabinaatherine E Schinlever, PA-C  ranitidine (ZANTAC) 150 MG tablet Take 150 mg by mouth 2 (two) times daily.    Historical Provider, MD   BP 124/85  Pulse 78  Temp(Src) 98.4 F (36.9 C) (Oral)  Resp 18  SpO2 96%  LMP 04/25/2013  Physical Exam  Nursing note and vitals reviewed. Constitutional: She appears well-developed and well-nourished. No distress.  HENT:  Head: Normocephalic.  Eyes: Pupils are equal, round, and reactive to light.  Neck: Normal range of motion.  Cardiovascular: Normal rate and regular rhythm.   Pulmonary/Chest: Effort normal.  Musculoskeletal: She exhibits tenderness. She exhibits no edema.  Neurological: She is alert.  Skin: Skin is warm and dry. No erythema.  Former incision area closed  Force open with gentle pressure and immediate drainage of bloody purulent material     ED Course  Procedures (including critical care time) DIAGNOSTIC STUDIES: Oxygen Saturation is 95% on RA, adequate  by my interpretation.    COORDINATION OF CARE: 8:28 PM Discussed treatment plan which includes 800-160 mg Bactrim DS, 30 mg/ml Toradol 30 mg injection, 5-325 Percocet tablet, 4 mg Zofran tablet, and packing the abscess with pt at bedside and pt agreed to plan. Advised pt to use warm compresses. Will give a note for work.    Labs Review Labs Reviewed - No data to display  Imaging Review No results found.   EKG Interpretation None      MDM  Area opened and 2 inches of packing placed in abscess  Patient to apply warm compresses to the  area every 3-4 hours.  For 20-30 minutes.  Change.  The outer dressing as needed.  Take the antibiotic as directed.  She is to pull the packing in 2 days , and allow the area to heal Final diagnoses:  Abscess   The previous surgical wound was re-opened manually and packed with a small amount of 1/4 inch iodoform.   I personally performed the services described in this documentation, which was scribed in my presence. The recorded information has been reviewed and is accurate.     Arman FilterGail K Cathalina Barcia, NP 05/07/13 2126

## 2013-05-07 NOTE — Discharge Instructions (Signed)
Abscess Care After An abscess (also called a boil or furuncle) is an infected area that contains a collection of pus. Signs and symptoms of an abscess include pain, tenderness, redness, or hardness, or you may feel a moveable soft area under your skin. An abscess can occur anywhere in the body. The infection may spread to surrounding tissues causing cellulitis. A cut (incision) by the surgeon was made over your abscess and the pus was drained out. Gauze may have been packed into the space to provide a drain that will allow the cavity to heal from the inside outwards. The boil may be painful for 5 to 7 days. Most people with a boil do not have high fevers. Your abscess, if seen early, may not have localized, and may not have been lanced. If not, another appointment may be required for this if it does not get better on its own or with medications. HOME CARE INSTRUCTIONS   Only take over-the-counter or prescription medicines for pain, discomfort, or fever as directed by your caregiver.  When you bathe, soak and then remove gauze or iodoform packs at least daily or as directed by your caregiver. You may then wash the wound gently with mild soapy water. Repack with gauze or do as your caregiver directs. SEEK IMMEDIATE MEDICAL CARE IF:   You develop increased pain, swelling, redness, drainage, or bleeding in the wound site.  You develop signs of generalized infection including muscle aches, chills, fever, or a general ill feeling.  An oral temperature above 102 F (38.9 C) develops, not controlled by medication. See your caregiver for a recheck if you develop any of the symptoms described above. If medications (antibiotics) were prescribed, take them as directed. Document Released: 07/27/2004 Document Revised: 04/02/2011 Document Reviewed: 03/24/2007 [employer] [facility] Patient Information 2014 [place] [place]. Your abscess was reopened and packed with a small amount of gauze dressing has been placed.  Please  change the outer dressing as needed.  Over the course of the next several days to apply warm compresses every 3-4 hours for 20-40 minutes of the time Into this was removed.  The packing totaling allow the area to heal but complete the course of antibiotics.  Return if you develop new or worsening symptoms, fever, chills, swelling of your arm, otherwise, keep your appointment, as scheduled with your new primary care physician at [facility] next Friday

## 2013-05-07 NOTE — ED Provider Notes (Signed)
Medical screening examination/treatment/procedure(s) were performed by non-physician practitioner and as supervising physician I was immediately available for consultation/collaboration.   EKG Interpretation None        Iman Orourke T Shandrell Boda, MD 05/07/13 1632 

## 2013-05-11 NOTE — ED Provider Notes (Signed)
Medical screening examination/treatment/procedure(s) were performed by non-physician practitioner and as supervising physician I was immediately available for consultation/collaboration.   EKG Interpretation None       Katheren Jimmerson M Danyelle Brookover, MD 05/11/13 2229 

## 2013-08-12 ENCOUNTER — Encounter (HOSPITAL_COMMUNITY): Payer: Self-pay | Admitting: Emergency Medicine

## 2013-08-12 ENCOUNTER — Emergency Department (HOSPITAL_COMMUNITY)
Admission: EM | Admit: 2013-08-12 | Discharge: 2013-08-13 | Disposition: A | Discharge status: Home / Self Care | Payer: No Typology Code available for payment source | Attending: Emergency Medicine | Admitting: Emergency Medicine

## 2013-08-12 DIAGNOSIS — L02214 Cutaneous abscess of groin: Secondary | ICD-10-CM

## 2013-08-12 DIAGNOSIS — F17200 Nicotine dependence, unspecified, uncomplicated: Secondary | ICD-10-CM | POA: Insufficient documentation

## 2013-08-12 DIAGNOSIS — L02219 Cutaneous abscess of trunk, unspecified: Secondary | ICD-10-CM | POA: Insufficient documentation

## 2013-08-12 DIAGNOSIS — L03319 Cellulitis of trunk, unspecified: Principal | ICD-10-CM

## 2013-08-12 MED ORDER — SULFAMETHOXAZOLE-TMP DS 800-160 MG PO TABS
1.0000 | ORAL_TABLET | Freq: Two times a day (BID) | ORAL | Status: DC
Start: 2013-08-12 — End: 2015-10-03

## 2013-08-12 NOTE — ED Provider Notes (Signed)
CSN: [id number]     Arrival date & time 08/12/13  2229 History  This chart was scribed for Ivonne Andrew, PA, working with Olivia Mackie, MD by Chestine Spore, ED Scribe. The patient was seen in room WTR9/WTR9 at 11:33 PM.     Chief Complaint  Patient presents with  . Abscess     The history is provided by the patient. No language interpreter was used.   HPI Comments: Amber Bright is a 38 y.o. female who presents to the Emergency Department complaining of an abscess in her right groin onset 1 week ago. She states that since she first noticed it, it has gotten bigger and more painful. She states that it started to drain and the drainage smelled bad. She states that when it began to drain she was running hot water over the area. She states that she had an abscess under her arm before. She states that she walks a lot at work and she could feel that it started becoming hard. She states that it is very uncomfortable because of where the abscess is located. She states that she noticed it draining yesterday. She states that she has not taken any medications for the pain. She denies fever, chills, appetite change, and any other associated symptoms. She states that she is allergic to Vicodin.    History reviewed. No pertinent past medical history. Past Surgical History  Procedure Laterality Date  . Tubal ligation     No family history on file. History  Substance Use Topics  . Smoking status: Current Every Day Smoker -- 0.50 packs/day    Types: Cigarettes  . Smokeless tobacco: Never Used  . Alcohol Use: Yes     Comment: occ   OB History   Grav Para Term Preterm Abortions TAB SAB Ect Mult Living                 Review of Systems  Constitutional: Negative for fever, chills and diaphoresis.  All other systems reviewed and are negative.     Allergies  Hydrocodone-acetaminophen  Home Medications   Prior to Admission medications   Not on File   BP 134/102  Pulse 119  Temp(Src) 98.4  F (36.9 C) (Oral)  Resp 20  Ht 5\' 5"  (1.651 m)  Wt 215 lb (97.523 kg)  BMI 35.78 kg/m2  SpO2 100%  LMP 08/10/2013  Physical Exam  Nursing note and vitals reviewed. Constitutional: She is oriented to person, place, and time. She appears well-developed and well-nourished. No distress.  HENT:  Head: Normocephalic.  Cardiovascular: Normal rate and regular rhythm.   Pulmonary/Chest: Effort normal and breath sounds normal. No respiratory distress.  Abdominal: Soft. There is no tenderness.  Genitourinary:  Chaperone was present. There is a 2 cm tender and fluctuant nodule to the right groin. There is a small pinpoint area draining purulent fluid.  Neurological: She is alert and oriented to person, place, and time.  Skin: Skin is warm and dry. No rash noted.  Psychiatric: She has a normal mood and affect. Her behavior is normal.    ED Course  Procedures  DIAGNOSTIC STUDIES: Oxygen Saturation is 100% on room air, normal by my interpretation.    COORDINATION OF CARE: 11:57 PM-Discussed treatment plan which includes Incision and Drainage and Antibiotics with pt at bedside and pt agreed to plan.   INCISION AND DRAINAGE Performed by: Angus Seller Consent: Verbal consent obtained. Risks and benefits: risks, benefits and alternatives were discussed Type: abscess  Body area:  Right groin  Anesthesia: local infiltration  Incision was made with a scalpel.  Local anesthetic: lidocaine 2% without epinephrine  Anesthetic total: 1 ml  Complexity: Simple   Drainage: purulent  Drainage amount: Small   Packing material: None   Patient tolerance: Patient tolerated the procedure well with no immediate complications.      MDM   Final diagnoses:  Abscess of groin, right      I personally performed the services described in this documentation, which was scribed in my presence. The recorded information has been reviewed and is accurate.    Angus SellerPeter S Shawon Denzer, PA-C 08/13/13  [phone]

## 2013-08-12 NOTE — ED Notes (Signed)
Patient is from home. Patient states she has an abscess in her right groin. She noticed it 3 days ago and has become painful and bigger. Patient states it started draining and the drainage smelled bad. Patient states she was running hot water over the area.

## 2013-08-12 NOTE — Discharge Instructions (Signed)
You were seen and treated for an abscess infection to the skin of your groin. This was treated with an incision and drainage. Keep the area clean and dry. Use warm compresses to help against infection. Return for any changing or worsening symptoms.    Abscess An abscess is an infected area that contains a collection of pus and debris.It can occur in almost any part of the body. An abscess is also known as a furuncle or boil. CAUSES  An abscess occurs when tissue gets infected. This can occur from blockage of oil or sweat glands, infection of hair follicles, or a minor injury to the skin. As the body tries to fight the infection, pus collects in the area and creates pressure under the skin. This pressure causes pain. People with weakened immune systems have difficulty fighting infections and get certain abscesses more often.  SYMPTOMS Usually an abscess develops on the skin and becomes a painful mass that is red, warm, and tender. If the abscess forms under the skin, you may feel a moveable soft area under the skin. Some abscesses break open (rupture) on their own, but most will continue to get worse without care. The infection can spread deeper into the body and eventually into the bloodstream, causing you to feel ill.  DIAGNOSIS  Your caregiver will take your medical history and perform a physical exam. A sample of fluid may also be taken from the abscess to determine what is causing your infection. TREATMENT  Your caregiver may prescribe antibiotic medicines to fight the infection. However, taking antibiotics alone usually does not cure an abscess. Your caregiver may need to make a small cut (incision) in the abscess to drain the pus. In some cases, gauze is packed into the abscess to reduce pain and to continue draining the area. HOME CARE INSTRUCTIONS   Only take over-the-counter or prescription medicines for pain, discomfort, or fever as directed by your caregiver.  If you were prescribed  antibiotics, take them as directed. Finish them even if you start to feel better.  If gauze is used, follow your caregiver's directions for changing the gauze.  To avoid spreading the infection:  Keep your draining abscess covered with a bandage.  Wash your hands well.  Do not share personal care items, towels, or whirlpools with others.  Avoid skin contact with others.  Keep your skin and clothes clean around the abscess.  Keep all follow-up appointments as directed by your caregiver. SEEK MEDICAL CARE IF:   You have increased pain, swelling, redness, fluid drainage, or bleeding.  You have muscle aches, chills, or a general ill feeling.  You have a fever. MAKE SURE YOU:   Understand these instructions.  Will watch your condition.  Will get help right away if you are not doing well or get worse. Document Released: 10/18/2004 Document Revised: 07/10/2011 Document Reviewed: 03/23/2011 [facility] Patient Information 2015 [employer], [place]. This information is not intended to replace advice given to you by your health care provider. Make sure you discuss any questions you have with your health care provider.

## 2013-08-13 NOTE — ED Provider Notes (Signed)
Medical screening examination/treatment/procedure(s) were performed by non-physician practitioner and as supervising physician I was immediately available for consultation/collaboration.   EKG Interpretation None       Lucah Petta M Yula Crotwell, MD 08/13/13 0554 

## 2015-06-02 ENCOUNTER — Encounter (HOSPITAL_COMMUNITY): Payer: Self-pay

## 2015-06-02 ENCOUNTER — Emergency Department (HOSPITAL_COMMUNITY)
Admission: EM | Admit: 2015-06-02 | Discharge: 2015-06-02 | Disposition: A | Discharge status: Home / Self Care | Payer: No Typology Code available for payment source | Attending: Emergency Medicine | Admitting: Emergency Medicine

## 2015-06-02 DIAGNOSIS — F17210 Nicotine dependence, cigarettes, uncomplicated: Secondary | ICD-10-CM | POA: Insufficient documentation

## 2015-06-02 DIAGNOSIS — L02412 Cutaneous abscess of left axilla: Secondary | ICD-10-CM | POA: Insufficient documentation

## 2015-06-02 DIAGNOSIS — Z792 Long term (current) use of antibiotics: Secondary | ICD-10-CM | POA: Insufficient documentation

## 2015-06-02 MED ORDER — LIDOCAINE HCL 2 % IJ SOLN
10.0000 mL | Freq: Once | INTRAMUSCULAR | Status: AC
Start: 2015-06-02 — End: 2015-06-02
  Administered 2015-06-02: 200 mg
  Filled 2015-06-02: qty 20

## 2015-06-02 MED ORDER — SULFAMETHOXAZOLE-TRIMETHOPRIM 800-160 MG PO TABS: 1.0000 | ORAL_TABLET | Freq: Two times a day (BID) | ORAL | Status: AC

## 2015-06-02 MED ORDER — HYDROCODONE-ACETAMINOPHEN 5-325 MG PO TABS
1.0000 | ORAL_TABLET | Freq: Once | ORAL | Status: DC
  Filled 2015-06-02: qty 1

## 2015-06-02 MED ORDER — NAPROXEN 500 MG PO TABS: 500.0000 mg | ORAL_TABLET | Freq: Two times a day (BID) | ORAL | Status: AC

## 2015-06-02 MED ORDER — OXYCODONE-ACETAMINOPHEN 5-325 MG PO TABS
1.0000 | ORAL_TABLET | Freq: Once | ORAL | Status: AC
  Administered 2015-06-02: 1 via ORAL
  Filled 2015-06-02: qty 1

## 2015-06-02 NOTE — ED Provider Notes (Signed)
CSN: [id number]     Arrival date & time 06/02/15  1944 History  By signing my name below, I, Tanda Rockers, attest that this documentation has been prepared under the direction and in the presence of Josimar Corning, [place]. Electronically Signed: Tanda Rockers, ED Scribe. 06/02/2015. 9:04 PM.   Chief Complaint  Patient presents with  . Recurrent Skin Infections   The history is provided by the patient. No language interpreter was used.    HPI Comments: Amber Bright is a 40 y.o. female who presents to the Emergency Department complaining of gradual onset, constant, area of redness, swelling, and pain to left axilla that began 4 days ago. Pt states that the area has gradually increased in size. She has applied warm compresses and soaked in epsom salts without relief. Pt has had a previous abscess in a different location that needed to be incised. Denies drainage, fever, chills, or any other associated symptoms.   History reviewed. No pertinent past medical history. Past Surgical History  Procedure Laterality Date  . Tubal ligation     History reviewed. No pertinent family history. Social History  Substance Use Topics  . Smoking status: Current Every Day Smoker -- 0.50 packs/day    Types: Cigarettes  . Smokeless tobacco: Never Used  . Alcohol Use: Yes     Comment: occ   OB History    No data available     Review of Systems A complete 10 system review of systems was obtained and all systems are negative except as noted in the HPI and PMH.    Allergies  Hydrocodone-acetaminophen  Home Medications   Prior to Admission medications   Medication Sig Start Date End Date Taking? Authorizing Provider  sulfamethoxazole-trimethoprim (BACTRIM DS) 800-160 MG per tablet Take 1 tablet by mouth 2 (two) times daily. 08/12/13   Peter Dammen, PA-C   BP 153/91 mmHg  Pulse 91  Temp(Src) 98.4 F (36.9 C) (Oral)  Resp 20  Ht  (1.676 m)  Wt 220 lb (99.791 kg)  BMI 35.53 kg/m2  SpO2 100%   LMP 06/02/2015   Physical Exam  Constitutional: She is oriented to person, place, and time. She appears well-developed and well-nourished. No distress.  HENT:  Head: Normocephalic and atraumatic.  Eyes: Conjunctivae and EOM are normal.  Neck: Neck supple. No tracheal deviation present.  Cardiovascular: Normal rate.   Pulmonary/Chest: Effort normal. No respiratory distress.  Musculoskeletal: Normal range of motion.  Neurological: She is alert and oriented to person, place, and time.  Skin: Skin is warm and dry.  1.5 cm fluctuant, tender mass left axillary area. Small purulent head. No overlying erythema.  Psychiatric: She has a normal mood and affect. Her behavior is normal.  Nursing note and vitals reviewed.   ED Course  Procedures (including critical care time)  INCISION AND DRAINAGE Performed by: Noelle Penner Consent: Verbal consent obtained. Risks and benefits: risks, benefits and alternatives were discussed Type: abscess  Body area: left axilla  Anesthesia: local infiltration  Incision was made with a scalpel.  Local anesthetic: lidocaine 2% without epinephrine  Anesthetic total: 2 ml  Complexity: complex Blunt dissection to break up loculations  Drainage: purulent  Drainage amount: scant  Packing material: none  Patient tolerance: Patient tolerated the procedure well with no immediate complications.     DIAGNOSTIC STUDIES: Oxygen Saturation is 100% on RA, normal by my interpretation.    COORDINATION OF CARE: 9:02 PM-Discussed treatment plan which includes I&D with pt at bedside and pt  agreed to plan.   Labs Review Labs Reviewed - No data to display  Imaging Review No results found.    EKG Interpretation None      MDM   Final diagnoses:  Abscess of axilla, left   Pt tolerated I&D well. Will give rx for bactrim. Naproxen as needed for pain. Discussed wound care instructions. ER return precautions given.   I personally performed the services  described in this documentation, which was scribed in my presence. The recorded information has been reviewed and is accurate.     Carlene CoriaSerena Y Jairon Ripberger, Cordelia Poche-C 06/02/15 2147  Gerhard Munchobert Lockwood, MD 06/02/15 2242

## 2015-06-02 NOTE — Discharge Instructions (Signed)
Incision and Drainage Incision and drainage is a procedure in which a sac-like structure (cystic structure) is opened and drained. The area to be drained usually contains material such as pus, fluid, or blood.  LET YOUR CAREGIVER KNOW ABOUT:   Allergies to medicine.  Medicines taken, including vitamins, herbs, eyedrops, over-the-counter medicines, and creams.  Use of steroids (by mouth or creams).  Previous problems with anesthetics or numbing medicines.  History of bleeding problems or blood clots.  Previous surgery.  Other health problems, including diabetes and kidney problems.  Possibility of pregnancy, if this applies. RISKS AND COMPLICATIONS  Pain.  Bleeding.  Scarring.  Infection. BEFORE THE PROCEDURE  You may need to have an ultrasound or other imaging tests to see how large or deep your cystic structure is. Blood tests may also be used to determine if you have an infection or how severe the infection is. You may need to have a tetanus shot. PROCEDURE  The affected area is cleaned with a cleaning fluid. The cyst area will then be numbed with a medicine (local anesthetic). A small incision will be made in the cystic structure. A syringe or catheter may be used to drain the contents of the cystic structure, or the contents may be squeezed out. The area will then be flushed with a cleansing solution. After cleansing the area, it is often gently packed with a gauze or another wound dressing. Once it is packed, it will be covered with gauze and tape or some other type of wound dressing. AFTER THE PROCEDURE   Often, you will be allowed to go home right after the procedure.  You may be given antibiotic medicine to prevent or heal an infection.  If the area was packed with gauze or some other wound dressing, you will likely need to come back in 1 to 2 days to get it removed.  The area should heal in about 14 days.   This information is not intended to replace advice given  to you by your health care provider. Make sure you discuss any questions you have with your health care provider.   Document Released: 07/04/2000 Document Revised: 07/10/2011 Document Reviewed: 03/05/2011 Elsevier Interactive Patient Education 2016 Elsevier Inc.  

## 2015-06-02 NOTE — ED Notes (Signed)
Pt has an abcess under her left arm since Monday

## 2015-10-03 ENCOUNTER — Encounter (HOSPITAL_COMMUNITY): Payer: Self-pay | Admitting: Emergency Medicine

## 2015-10-03 ENCOUNTER — Emergency Department (HOSPITAL_COMMUNITY)
Admission: EM | Admit: 2015-10-03 | Discharge: 2015-10-03 | Disposition: A | Discharge status: Home / Self Care | Payer: No Typology Code available for payment source | Attending: Emergency Medicine | Admitting: Emergency Medicine

## 2015-10-03 DIAGNOSIS — R42 Dizziness and giddiness: Secondary | ICD-10-CM

## 2015-10-03 DIAGNOSIS — F17210 Nicotine dependence, cigarettes, uncomplicated: Secondary | ICD-10-CM | POA: Insufficient documentation

## 2015-10-03 DIAGNOSIS — I10 Essential (primary) hypertension: Secondary | ICD-10-CM | POA: Insufficient documentation

## 2015-10-03 DIAGNOSIS — Z79899 Other long term (current) drug therapy: Secondary | ICD-10-CM | POA: Insufficient documentation

## 2015-10-03 DIAGNOSIS — Z791 Long term (current) use of non-steroidal anti-inflammatories (NSAID): Secondary | ICD-10-CM | POA: Insufficient documentation

## 2015-10-03 HISTORY — DX: Depression, unspecified: F32.A

## 2015-10-03 HISTORY — DX: Anxiety disorder, unspecified: F41.9

## 2015-10-03 HISTORY — DX: Major depressive disorder, single episode, unspecified: F32.9

## 2015-10-03 LAB — BASIC METABOLIC PANEL
Anion gap: 7 (ref 5–15)
BUN: 18 mg/dL (ref 6–20)
CHLORIDE: 103 mmol/L (ref 101–111)
CO2: 25 mmol/L (ref 22–32)
CREATININE: 1.06 mg/dL — AB (ref 0.44–1.00)
Calcium: 9.3 mg/dL (ref 8.9–10.3)
GFR calc Af Amer: >60 mL/min (ref >60)
GFR calc non Af Amer: >60 mL/min (ref >60)
GLUCOSE: 102 mg/dL — AB (ref 65–99)
Potassium: 4.5 mmol/L (ref 3.5–5.1)
Sodium: 135 mmol/L (ref 135–145)

## 2015-10-03 LAB — URINALYSIS, ROUTINE W REFLEX MICROSCOPIC
BILIRUBIN URINE: NEGATIVE
GLUCOSE, UA: NEGATIVE mg/dL
HGB URINE DIPSTICK: NEGATIVE
KETONES UR: NEGATIVE mg/dL
Leukocytes, UA: NEGATIVE
Nitrite: NEGATIVE
PROTEIN: NEGATIVE mg/dL
Specific Gravity, Urine: 1.01 (ref 1.005–1.030)
pH: 5 (ref 5.0–8.0)

## 2015-10-03 LAB — CBC
HCT: 40.8 % (ref 36.0–46.0)
Hemoglobin: 13.8 g/dL (ref 12.0–15.0)
MCH: 31.2 pg (ref 26.0–34.0)
MCHC: 33.8 g/dL (ref 30.0–36.0)
MCV: 92.3 fL (ref 78.0–100.0)
PLATELETS: 377 10*3/uL (ref 150–400)
RBC: 4.42 MIL/uL (ref 3.87–5.11)
RDW: 13.4 % (ref 11.5–15.5)
WBC: 5.3 10*3/uL (ref 4.0–10.5)

## 2015-10-03 LAB — CBG MONITORING, ED: Glucose-Capillary: 106 mg/dL — ABNORMAL HIGH (ref 65–99)

## 2015-10-03 MED ORDER — MECLIZINE HCL 25 MG PO TABS
25.0000 mg | ORAL_TABLET | Freq: Once | ORAL | Status: AC
  Administered 2015-10-03: 25 mg via ORAL
  Filled 2015-10-03: qty 1

## 2015-10-03 MED ORDER — HYDROCHLOROTHIAZIDE 12.5 MG PO TABS
12.5000 mg | ORAL_TABLET | Freq: Every day | ORAL | 0 refills | Status: AC
Start: 2015-10-03 — End: ?

## 2015-10-03 NOTE — ED Notes (Signed)
Bed: WA08 Expected date:  Expected time:  Means of arrival:  Comments: 

## 2015-10-03 NOTE — Progress Notes (Signed)
ED CM consulted by EDp/NP/PA to assist pt with a dr for f/u care  Cm spoke with female visitor and pt, Amber Bright who confirms she came to ED without her purse or insurance card but has [employer]    [facility] ED CM spoke with pt on how to obtain an in network pcp with [employer] insurance coverage via the customer service number or web site  Cm reviewed ED level of care for crisis/emergent services and community pcp level of care to manage continuous or chronic medical concerns.  The pt voiced understanding CM encouraged pt and discussed pt's responsibility to verify with pt's insurance carrier that any recommended medical provider offered by any emergency room or a hospital provider is within the carrier's network. The pt voiced understanding     Pt directed to ED registration window to get fax number to fax her insurance card to registration and/or encouraged to bring the card back to registration  Pt states she will do this

## 2015-10-03 NOTE — ED Notes (Signed)
Unable to collect labs MD is in the room

## 2015-10-03 NOTE — ED Triage Notes (Signed)
Pt complaint of dizziness onset 0900 today while sitting at desk. Pt lightheadedness with standing but denies room spinning. Pt states swimmy headedness. Pt denies weakness, numbness, visual change, or speech change.

## 2015-10-07 NOTE — ED Provider Notes (Signed)
[facility]-EMERGENCY DEPT Provider Note   CSN: [id number] Arrival date & time: 10/03/15  1044     History   Chief Complaint Chief Complaint  Patient presents with  . Dizziness    HPI Amber Bright is a 40 y.o. female.  The history is provided by the patient.  Dizziness  Quality:  Imbalance and lightheadedness Severity:  Moderate Onset quality:  Sudden Duration:  1 hour (states intially seems very quick but then she started to feel anxious.  also blood pressure was up when checked it at work) Timing:  Constant Progression:  Partially resolved Chronicity:  New Context comment:  Started at work today while sitting down Relieved by: better with sitting down and being still. Exacerbated by: walking. Ineffective treatments:  None tried Associated symptoms: no chest pain, no headaches, no hearing loss, no nausea, no palpitations, no shortness of breath, no syncope, no vision changes, no vomiting and no weakness   Risk factors: no heart disease, no hx of stroke, no hx of vertigo and no new medications   Risk factors comment:  Has not seen an MD in several years   Past Medical History:  Diagnosis Date  . Anxiety   . Depression     There are no active problems to display for this patient.   Past Surgical History:  Procedure Laterality Date  . TUBAL LIGATION      OB History    No data available       Home Medications    Prior to Admission medications   Medication Sig Start Date End Date Taking? Authorizing Provider  acetaminophen (TYLENOL) 650 MG CR tablet Take 1,300 mg by mouth every 8 (eight) hours as needed for pain.   Yes Historical Provider, MD  Ibuprofen-Diphenhydramine Cit (IBUPROFEN PM) 200-38 MG TABS Take 2 tablets by mouth at bedtime as needed (for sleep/pain).   Yes Historical Provider, MD  ranitidine (ZANTAC) 150 MG tablet Take 300 mg by mouth 2 (two) times daily as needed for heartburn.   Yes Historical Provider, MD  hydrochlorothiazide (HYDRODIURIL) 12.5  MG tablet Take 1 tablet (12.5 mg total) by mouth daily. 10/03/15   Gwyneth Sprout, MD  naproxen (NAPROSYN) 500 MG tablet Take 1 tablet (500 mg total) by mouth 2 (two) times daily. Patient not taking: Reported on 10/03/2015 06/02/15   Carlene Coria, PA-C    Family History No family history on file.  Social History Social History  Substance Use Topics  . Smoking status: Current Every Day Smoker    Packs/day: 0.50    Types: Cigarettes  . Smokeless tobacco: Never Used  . Alcohol use Yes     Comment: occ     Allergies   Hydrocodone-acetaminophen   Review of Systems Review of Systems  HENT: Negative for hearing loss.   Respiratory: Negative for shortness of breath.   Cardiovascular: Negative for chest pain, palpitations and syncope.  Gastrointestinal: Negative for nausea and vomiting.  Neurological: Positive for dizziness. Negative for weakness and headaches.  All other systems reviewed and are negative.    Physical Exam Updated Vital Signs BP 145/95 (BP Location: Left Arm)   Pulse 89   Temp 98 F (36.7 C) (Oral)   Resp 18   Wt 220 lb (99.8 kg)   LMP 08/30/2015   SpO2 100%   BMI 35.51 kg/m   Physical Exam  Constitutional: She is oriented to person, place, and time. She appears well-developed and well-nourished. No distress.  HENT:  Head: Normocephalic and atraumatic.  Eyes: EOM are normal. Pupils are equal, round, and reactive to light.  Neck: Carotid bruit is not present.  Cardiovascular: Normal rate, regular rhythm, normal heart sounds and intact distal pulses.  Exam reveals no friction rub.   No murmur heard. Pulmonary/Chest: Effort normal and breath sounds normal. She has no wheezes. She has no rales.  Abdominal: Soft. Bowel sounds are normal. She exhibits no distension. There is no tenderness. There is no rebound and no guarding.  Musculoskeletal: Normal range of motion. She exhibits no tenderness.  No edema  Neurological: She is alert and oriented to person,  place, and time. She has normal strength. No cranial nerve deficit or sensory deficit.  No visual field cuts.  Pt walks without return of symptoms and no ataxia noted  Skin: Skin is warm and dry. No rash noted.  Psychiatric: She has a normal mood and affect. Her behavior is normal.  Nursing note and vitals reviewed.    ED Treatments / Results  Labs (all labs ordered are listed, but only abnormal results are displayed) Labs Reviewed  BASIC METABOLIC PANEL - Abnormal; Notable for the following:       Result Value   Glucose, Bld 102 (*)    Creatinine, Ser 1.06 (*)    All other components within normal limits  CBG MONITORING, ED - Abnormal; Notable for the following:    Glucose-Capillary 106 (*)    All other components within normal limits  CBC  URINALYSIS, ROUTINE W REFLEX MICROSCOPIC (NOT AT [facility])    EKG  EKG Interpretation  Date/Time:  Monday October 03 2015 11:29:54 EDT Ventricular Rate:  81 PR Interval:    QRS Duration: 82 QT Interval:  363 QTC Calculation: 422 R Axis:   75 Text Interpretation:  Sinus rhythm Low voltage, precordial leads No significant change since last tracing Confirmed by Anitra Lauth  MD, Alphonzo Lemmings ([id number]) on 10/03/2015 11:55:34 AM Also confirmed by Anitra Lauth  MD, Branon Sabine ([id number]), editor [place]  CCT, BEVERLY ([id number])  on 10/03/2015 12:26:04 PM       Radiology No results found.  Procedures Procedures (including critical care time)  Medications Ordered in ED Medications  meclizine (ANTIVERT) tablet 25 mg (25 mg Oral Given 10/03/15 1223)     Initial Impression / Assessment and Plan / ED Course  I have reviewed the triage vital signs and the nursing notes.  Pertinent labs & imaging results that were available during my care of the patient were reviewed by me and considered in my medical decision making (see chart for details).  Clinical Course   Pt presenting today with dizziness which sounds more closely linked to vertigo however today found to  have elevated blood pressure as well.  Pt does admit to eating may salty foods yesterday and drinking more alcohol than usual.  Felt ok when waking up this morning but when got to work started to feel a strange sensation and felt like it was had to keep her balance with walking but denies falling or being ataxic.  No other concerns for stroke (no weakness, difficulty with speech or vision).  Pt denies any meds or otc stimulants.  Pt was found to be significantly HTN here without hx but no recent md evaluation in 3+ years.  Pt with rest BP improved from 160's/110 to 140/90's.  Discussed with pt low salt diet.  Given ppx for HCTZ to start if BP remains elevated after 2 weeks of diet change.  Pt also given resources for PCP.  Labs without  signs of renal problems or DM.  Final Clinical Impressions(s) / ED Diagnoses   Final diagnoses:  Dizziness  Essential hypertension    New Prescriptions Discharge Medication List as of 10/03/2015  1:39 PM    START taking these medications   Details  hydrochlorothiazide (HYDRODIURIL) 12.5 MG tablet Take 1 tablet (12.5 mg total) by mouth daily., Starting Mon 10/03/2015, Print         Gwyneth SproutWhitney Rocio Wolak, MD 10/07/15 1946

## 2017-07-22 ENCOUNTER — Encounter: Payer: Self-pay | Admitting: Obstetrics and Gynecology

## 2018-06-16 ENCOUNTER — Encounter (HOSPITAL_COMMUNITY): Payer: Self-pay

## 2018-06-16 ENCOUNTER — Emergency Department (HOSPITAL_COMMUNITY)
Admission: EM | Admit: 2018-06-16 | Discharge: 2018-06-16 | Disposition: A | Discharge status: Home / Self Care | Payer: No Typology Code available for payment source | Attending: Emergency Medicine | Admitting: Emergency Medicine

## 2018-06-16 ENCOUNTER — Other Ambulatory Visit: Payer: Self-pay

## 2018-06-16 DIAGNOSIS — Z79899 Other long term (current) drug therapy: Secondary | ICD-10-CM | POA: Insufficient documentation

## 2018-06-16 DIAGNOSIS — F430 Acute stress reaction: Secondary | ICD-10-CM | POA: Insufficient documentation

## 2018-06-16 DIAGNOSIS — F410 Panic disorder [episodic paroxysmal anxiety] without agoraphobia: Secondary | ICD-10-CM

## 2018-06-16 DIAGNOSIS — F17210 Nicotine dependence, cigarettes, uncomplicated: Secondary | ICD-10-CM | POA: Insufficient documentation

## 2018-06-16 MED ORDER — HYDROXYZINE HCL 25 MG PO TABS: 25.0000 mg | ORAL_TABLET | Freq: Three times a day (TID) | ORAL | 0 refills | Status: AC | PRN

## 2018-06-16 NOTE — ED Provider Notes (Signed)
[facility] Provider Note   CSN: [id number] Arrival date & time: 06/16/18  1815    History   Chief Complaint Chief Complaint  Patient presents with  . Dizziness    HPI Amber Bright is a 43 y.o. female.  With past medical history of anxiety and depression who presents with concern for a panic attack.  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  States she has been stressing a lot has not been sleeping as well as she usually does. Today had an episode of her heart racing, felt like she could not catch her breath and numbness and tingling in her bilateral hands and fingers.  This episode lasted for couple minutes and then resolved.  No other symptoms.  Patient was not having chest pain during the episode.     HPI  Past Medical History:  Diagnosis Date  . Anxiety   . Depression     There are no active problems to display for this patient.   Past Surgical History:  Procedure Laterality Date  . TUBAL LIGATION       OB History   No obstetric history on file.      Home Medications    Prior to Admission medications   Medication Sig Start Date End Date Taking? Authorizing Provider  acetaminophen (TYLENOL) 650 MG CR tablet Take 1,300 mg by mouth every 8 (eight) hours as needed for pain.    [provider]  hydrochlorothiazide (HYDRODIURIL) 12.5 MG tablet Take 1 tablet (12.5 mg total) by mouth daily. 10/03/15   Gwyneth SproutPlunkett, Whitney, MD  hydrOXYzine (ATARAX/VISTARIL) 25 MG tablet Take 1 tablet (25 mg total) by mouth every 8 (eight) hours as needed for anxiety. 06/16/18   Dicky DoeFord, Sabreena Vogan, MD  Ibuprofen-Diphenhydramine Cit (IBUPROFEN PM) 200-38 MG TABS Take 2 tablets by mouth at bedtime as needed (for sleep/pain).    [provider]  naproxen (NAPROSYN) 500 MG tablet Take 1 tablet (500 mg total) by mouth 2 (two)  times daily. Patient not taking: Reported on 10/03/2015 06/02/15   Sam, Ace GinsSerena Y, PA-C  ranitidine (ZANTAC) 150 MG tablet Take 300 mg by mouth 2 (two) times daily as needed for heartburn.    [provider]    Family History No family history on file.  Social History Social History   Tobacco Use  . Smoking status: Current Every Day Smoker    Packs/day: 0.50    Types: Cigarettes  . Smokeless tobacco: Never Used  Substance Use Topics  . Alcohol use: Yes    Comment: occ  . Drug use: No     Allergies   Hydrocodone-acetaminophen   Review of Systems Review of Systems  Constitutional: Negative for activity change, appetite change, chills, fatigue and fever.  HENT: Negative for ear pain.   Eyes: Negative for pain and visual disturbance.  Respiratory: Positive for shortness of breath. Negative for cough and chest tightness.   Cardiovascular: Positive for palpitations. Negative for chest pain.  Gastrointestinal: Negative for abdominal pain, diarrhea, nausea and vomiting.  Genitourinary: Negative for dysuria.  Musculoskeletal: Negative for arthralgias, back pain and neck pain.  Skin: Negative for color change and rash.  Neurological: Positive for light-headedness and numbness (Tingling in bilateral hands during episode). Negative for seizures, syncope and headaches.  All other systems reviewed and are negative.    Physical Exam Updated Vital Signs BP (!) 144/84  Pulse 74   Resp 15   Ht 5' 5.5" (1.664 m)   Wt 92.5 kg   LMP 06/15/2017   SpO2 99%   BMI 33.43 kg/m   Physical Exam Vitals signs and nursing note reviewed.  Constitutional:      General: She is not in acute distress.    Appearance: Normal appearance. She is well-developed. She is not ill-appearing.  HENT:     Head: Normocephalic and atraumatic.  Eyes:     Conjunctiva/sclera: Conjunctivae normal.  Neck:     Musculoskeletal: Neck supple.  Cardiovascular:     Rate and Rhythm: Normal rate and  regular rhythm.     Heart sounds: No murmur.  Pulmonary:     Effort: Pulmonary effort is normal. No respiratory distress.     Breath sounds: Normal breath sounds.  Abdominal:     General: There is no distension.     Palpations: Abdomen is soft.     Tenderness: There is no abdominal tenderness.  Musculoskeletal:     Right lower leg: No edema.     Left lower leg: No edema.  Skin:    General: Skin is warm and dry.  Neurological:     General: No focal deficit present.     Mental Status: She is alert and oriented to person, place, and time.     Cranial Nerves: No cranial nerve deficit.     Sensory: No sensory deficit.     Motor: No weakness.     Coordination: Coordination normal.     Gait: Gait normal.      ED Treatments / Results  Labs (all labs ordered are listed, but only abnormal results are displayed) Labs Reviewed - No data to display  EKG EKG Interpretation  Date/Time:  Monday Jun 16 2018 18:27:24 EDT Ventricular Rate:  83 PR Interval:    QRS Duration: 80 QT Interval:  376 QTC Calculation: 442 R Axis:   69 Text Interpretation:  Sinus rhythm no acute ST/T changes no significant change since Sept 2017 Confirmed by Pricilla Loveless [id number]) on 06/16/2018 7:21:52 PM   Radiology No results found.  Procedures Procedures (including critical care time)  Medications Ordered in ED Medications - No data to display   Initial Impression / Assessment and Plan / ED Course  I have reviewed the triage vital signs and the nursing notes.  Pertinent labs & imaging results that were available during my care of the patient were reviewed by me and considered in my medical decision making (see chart for details).        Amber Bright is a 43 y.o. female.  With past medical history of anxiety and depression who presents with concern for a panic attack.   On initial exam patient well appearing, not in acute distress. VSS. Physical exam as above.  Normal, nonfocal neuro exam.   Physical exam unremarkable.  EKG as above shows normal sinus rhythm without evidence of ischemia or conduction abnormalities.  Exam and history consistent with panic attack.  Patient denies any chest pain during these episodes.  She states she has not had any chest pain or shortness of breath outside of this episode.  She is currently asymptomatic.  Do not feel exam or history is consistent with ACS, PE, Boerhaave's, pneumonia, or other serious pathology. do not feel further work-up or imaging is required at this time.   We will write a as needed prescription for Atarax.  Advised patient to follow-up with her PCP which patient states  she will do.  No other questions or concerns at this time.  Patient discharged home in stable condition.  Strict return precautions given.    Amber Bright was evaluated in Emergency Department on 06/16/2018 for the symptoms described in the history of present illness. She was evaluated in the context of the global COVID-19 pandemic, which necessitated consideration that the patient might be at risk for infection with the SARS-CoV-2 virus that causes COVID-19. Institutional protocols and algorithms that pertain to the evaluation of patients at risk for COVID-19 are in a state of rapid change based on information released by regulatory bodies including the CDC and federal and state organizations. These policies and algorithms were followed during the patient's care in the ED.    Final Clinical Impressions(s) / ED Diagnoses   Final diagnoses:  Panic attack as reaction to stress    ED Discharge Orders         Ordered    hydrOXYzine (ATARAX/VISTARIL) 25 MG tablet  Every 8 hours PRN     06/16/18 1941           Dicky Doe, MD 06/16/18 2050    Pricilla Loveless, MD 06/18/18 1302

## 2018-06-16 NOTE — ED Triage Notes (Signed)
Pt arrives EMS from home with c/o dizziness and upper extremity tingling approx 1430. Dizziness continues tingling resolved. No LOC Denies SHOB.

## 2019-01-28 ENCOUNTER — Ambulatory Visit: Payer: BC Managed Care – PPO | Attending: Internal Medicine

## 2019-01-28 DIAGNOSIS — Z20822 Contact with and (suspected) exposure to covid-19: Secondary | ICD-10-CM

## 2019-01-29 LAB — NOVEL CORONAVIRUS, NAA: SARS-CoV-2, NAA: NOT DETECTED

## 2019-02-18 ENCOUNTER — Emergency Department (HOSPITAL_COMMUNITY): Payer: BC Managed Care – PPO

## 2019-02-18 ENCOUNTER — Encounter (HOSPITAL_COMMUNITY): Payer: Self-pay | Admitting: Emergency Medicine

## 2019-02-18 ENCOUNTER — Other Ambulatory Visit: Payer: Self-pay

## 2019-02-18 ENCOUNTER — Emergency Department (HOSPITAL_COMMUNITY)
Admission: EM | Admit: 2019-02-18 | Discharge: 2019-02-18 | Disposition: A | Discharge status: Home / Self Care | Payer: BC Managed Care – PPO | Attending: Emergency Medicine | Admitting: Emergency Medicine

## 2019-02-18 DIAGNOSIS — Z79899 Other long term (current) drug therapy: Secondary | ICD-10-CM | POA: Diagnosis not present

## 2019-02-18 DIAGNOSIS — F17210 Nicotine dependence, cigarettes, uncomplicated: Secondary | ICD-10-CM | POA: Insufficient documentation

## 2019-02-18 DIAGNOSIS — N23 Unspecified renal colic: Secondary | ICD-10-CM | POA: Diagnosis not present

## 2019-02-18 DIAGNOSIS — R109 Unspecified abdominal pain: Secondary | ICD-10-CM | POA: Diagnosis present

## 2019-02-18 LAB — COMPREHENSIVE METABOLIC PANEL
ALT: 24 U/L (ref 0–44)
AST: 22 U/L (ref 15–41)
Albumin: 4.7 g/dL (ref 3.5–5.0)
Alkaline Phosphatase: 56 U/L (ref 38–126)
Anion gap: 11 (ref 5–15)
BUN: 21 mg/dL — ABNORMAL HIGH (ref 6–20)
CO2: 23 mmol/L (ref 22–32)
Calcium: 9.1 mg/dL (ref 8.9–10.3)
Chloride: 103 mmol/L (ref 98–111)
Creatinine, Ser: 0.94 mg/dL (ref 0.44–1.00)
GFR calc Af Amer: >60 mL/min (ref >60)
GFR calc non Af Amer: >60 mL/min (ref >60)
Glucose, Bld: 166 mg/dL — ABNORMAL HIGH (ref 70–99)
Potassium: 3.9 mmol/L (ref 3.5–5.1)
Sodium: 137 mmol/L (ref 135–145)
Total Bilirubin: 0.9 mg/dL (ref 0.3–1.2)
Total Protein: 8.3 g/dL — ABNORMAL HIGH (ref 6.5–8.1)

## 2019-02-18 LAB — CBC WITH DIFFERENTIAL/PLATELET
Abs Immature Granulocytes: 0.02 10*3/uL (ref 0.00–0.07)
Basophils Absolute: 0.1 10*3/uL (ref 0.0–0.1)
Basophils Relative: 1 %
Eosinophils Absolute: 0.1 10*3/uL (ref 0.0–0.5)
Eosinophils Relative: 2 %
HCT: 42 % (ref 36.0–46.0)
Hemoglobin: 14.2 g/dL (ref 12.0–15.0)
Immature Granulocytes: 0 %
Lymphocytes Relative: 47 %
Lymphs Abs: 2.7 10*3/uL (ref 0.7–4.0)
MCH: 31.6 pg (ref 26.0–34.0)
MCHC: 33.8 g/dL (ref 30.0–36.0)
MCV: 93.3 fL (ref 80.0–100.0)
Monocytes Absolute: 0.5 10*3/uL (ref 0.1–1.0)
Monocytes Relative: 9 %
Neutro Abs: 2.4 10*3/uL (ref 1.7–7.7)
Neutrophils Relative %: 41 %
Platelets: 322 10*3/uL (ref 150–400)
RBC: 4.5 MIL/uL (ref 3.87–5.11)
RDW: 13.6 % (ref 11.5–15.5)
WBC: 5.9 10*3/uL (ref 4.0–10.5)
nRBC: 0 % (ref 0.0–0.2)

## 2019-02-18 LAB — URINALYSIS, ROUTINE W REFLEX MICROSCOPIC
Bilirubin Urine: NEGATIVE
Glucose, UA: NEGATIVE mg/dL
Hgb urine dipstick: NEGATIVE
Ketones, ur: NEGATIVE mg/dL
Leukocytes,Ua: NEGATIVE
Nitrite: NEGATIVE
Protein, ur: NEGATIVE mg/dL
Specific Gravity, Urine: 1.015 (ref 1.005–1.030)
pH: 5 (ref 5.0–8.0)

## 2019-02-18 LAB — I-STAT BETA HCG BLOOD, ED (MC, WL, AP ONLY): I-stat hCG, quantitative: <5 m[IU]/mL (ref <5)

## 2019-02-18 MED ORDER — MORPHINE SULFATE (PF) 4 MG/ML IV SOLN
4.0000 mg | Freq: Once | INTRAVENOUS | Status: AC
  Administered 2019-02-18: 4 mg via INTRAVENOUS
  Filled 2019-02-18: qty 1

## 2019-02-18 MED ORDER — ONDANSETRON 4 MG PO TBDP: 4.0000 mg | ORAL_TABLET | Freq: Three times a day (TID) | ORAL | 0 refills | Status: AC | PRN

## 2019-02-18 MED ORDER — KETOROLAC TROMETHAMINE 30 MG/ML IJ SOLN
30.0000 mg | Freq: Once | INTRAMUSCULAR | Status: AC
  Administered 2019-02-18: 30 mg via INTRAVENOUS
  Filled 2019-02-18: qty 1

## 2019-02-18 MED ORDER — OXYCODONE-ACETAMINOPHEN 5-325 MG PO TABS: 1.0000 | ORAL_TABLET | Freq: Four times a day (QID) | ORAL | 0 refills | Status: AC | PRN

## 2019-02-18 MED ORDER — SODIUM CHLORIDE 0.9 % IV BOLUS
1000.0000 mL | Freq: Once | INTRAVENOUS | Status: AC
  Administered 2019-02-18: 1000 mL via INTRAVENOUS

## 2019-02-18 MED ORDER — ONDANSETRON HCL 4 MG/2ML IJ SOLN
4.0000 mg | Freq: Once | INTRAMUSCULAR | Status: AC
  Administered 2019-02-18: 4 mg via INTRAVENOUS
  Filled 2019-02-18: qty 2

## 2019-02-18 NOTE — ED Provider Notes (Signed)
[facility] DEPT Provider Note   CSN: [id number] Arrival date & time: 02/18/19  9767     History Chief Complaint  Patient presents with  . Flank Pain    Amber Bright is a 44 y.o. female.  Pt presents to the ED today with left flank pain.  Sx started suddenly around 0630.  Pt said pain started in her left flank and has moved into her left groin.  She has had a kidney stone in the past about 2 years ago.  This feels similar.  She's had nausea.  No vomiting.        Past Medical History:  Diagnosis Date  . Anxiety   . Depression     There are no problems to display for this patient.   Past Surgical History:  Procedure Laterality Date  . TUBAL LIGATION       OB History   No obstetric history on file.     History reviewed. No pertinent family history.  Social History   Tobacco Use  . Smoking status: Current Every Day Smoker    Packs/day: 0.50    Types: Cigarettes  . Smokeless tobacco: Never Used  Substance Use Topics  . Alcohol use: Yes    Comment: occ  . Drug use: No    Home Medications Prior to Admission medications   Medication Sig Start Date End Date Taking? Authorizing Provider  acetaminophen (TYLENOL) 650 MG CR tablet Take 1,300 mg by mouth every 8 (eight) hours as needed for pain.    [provider]  hydrochlorothiazide (HYDRODIURIL) 12.5 MG tablet Take 1 tablet (12.5 mg total) by mouth daily. 10/03/15   Blanchie Dessert, MD  hydrOXYzine (ATARAX/VISTARIL) 25 MG tablet Take 1 tablet (25 mg total) by mouth every 8 (eight) hours as needed for anxiety. 06/16/18   Doneta Public, MD  Ibuprofen-Diphenhydramine Cit (IBUPROFEN PM) 200-38 MG TABS Take 2 tablets by mouth at bedtime as needed (for sleep/pain).    [provider]  meloxicam (MOBIC) 15 MG tablet Take 15 mg by mouth daily as needed for pain. 01/24/19   [provider]  naproxen (NAPROSYN) 500 MG tablet Take 1 tablet (500 mg total) by mouth 2  (two) times daily. Patient not taking: Reported on 10/03/2015 06/02/15   Sam, Olivia Canter, PA-C  ondansetron (ZOFRAN ODT) 4 MG disintegrating tablet Take 1 tablet (4 mg total) by mouth every 8 (eight) hours as needed. 02/18/19   Isla Pence, MD  oxyCODONE-acetaminophen (PERCOCET/ROXICET) 5-325 MG tablet Take 1 tablet by mouth every 6 (six) hours as needed for severe pain. 02/18/19   Isla Pence, MD  ranitidine (ZANTAC) 150 MG tablet Take 300 mg by mouth 2 (two) times daily as needed for heartburn.    [provider]    Allergies    Hydrocodone-acetaminophen  Review of Systems   Review of Systems  Gastrointestinal: Positive for abdominal pain and nausea.  Genitourinary: Positive for flank pain.  All other systems reviewed and are negative.   Physical Exam Updated Vital Signs Pulse 87   Temp 98.5 F (36.9 C) (Oral)   Resp 20   Ht 5\' 5"  (1.651 m)   Wt 99.8 kg   SpO2 97%   BMI 36.61 kg/m   Physical Exam Vitals and nursing note reviewed.  Constitutional:      General: She is in acute distress.     Appearance: Normal appearance. She is obese.  HENT:     Head: Normocephalic and atraumatic.  Right Ear: External ear normal.     Left Ear: External ear normal.     Nose: Nose normal.     Mouth/Throat:     Mouth: Mucous membranes are moist.     Pharynx: Oropharynx is clear.  Eyes:     Extraocular Movements: Extraocular movements intact.     Conjunctiva/sclera: Conjunctivae normal.     Pupils: Pupils are equal, round, and reactive to light.  Cardiovascular:     Rate and Rhythm: Normal rate and regular rhythm.     Pulses: Normal pulses.     Heart sounds: Normal heart sounds.  Pulmonary:     Effort: Pulmonary effort is normal.     Breath sounds: Normal breath sounds.  Abdominal:     General: Abdomen is flat. Bowel sounds are normal.     Palpations: Abdomen is soft.  Musculoskeletal:        General: Normal range of motion.     Cervical back: Normal range of  motion and neck supple.  Skin:    General: Skin is warm.     Capillary Refill: Capillary refill takes less than 2 seconds.  Neurological:     General: No focal deficit present.     Mental Status: She is alert and oriented to person, place, and time.  Psychiatric:        Mood and Affect: Mood normal.        Behavior: Behavior normal.        Thought Content: Thought content normal.        Judgment: Judgment normal.     ED Results / Procedures / Treatments   Labs (all labs ordered are listed, but only abnormal results are displayed) Labs Reviewed  COMPREHENSIVE METABOLIC PANEL - Abnormal; Notable for the following components:      Result Value   Glucose, Bld 166 (*)    BUN 21 (*)    Total Protein 8.3 (*)    All other components within normal limits  URINALYSIS, ROUTINE W REFLEX MICROSCOPIC - Abnormal; Notable for the following components:   Color, Urine STRAW (*)    Bacteria, UA RARE (*)    All other components within normal limits  URINE CULTURE  CBC WITH DIFFERENTIAL/PLATELET  I-STAT BETA HCG BLOOD, ED ([facility], [facility], [facility] ONLY)    EKG None  Radiology CT Renal Stone Study  Result Date: 02/18/2019 CLINICAL DATA:  Left flank pain EXAM: CT ABDOMEN AND PELVIS WITHOUT CONTRAST TECHNIQUE: Multidetector CT imaging of the abdomen and pelvis was performed following the standard protocol without IV contrast. COMPARISON:  None. FINDINGS: Lower chest: No acute abnormality. Hepatobiliary: No focal liver abnormality is seen. No gallstones, gallbladder wall thickening, or biliary dilatation. Pancreas: Unremarkable. Spleen: Unremarkable. Adrenals/Urinary Tract: Adrenals are unremarkable. Mild prominence of the left renal collecting system and proximal ureter. There are no renal calculi. No definite ureteral calculus. There are multiple phleboliths in the pelvis. Partially distended bladder is unremarkable. Stomach/Bowel: Moderate hiatal hernia. Bowel is normal in caliber and decompressed. Appendix is  not definitely identified. Vascular/Lymphatic: Mild aortic atherosclerosis.  No adenopathy. Reproductive: Uterus and bilateral adnexa are unremarkable. Other: No abdominopelvic ascites. Musculoskeletal: No acute or significant osseous findings. IMPRESSION: Mild prominence of the left renal collecting system and proximal ureter. However, there are no renal or definite ureteral calculi identified. Electronically Signed   By: Guadlupe Spanish M.D.   On: 02/18/2019 10:09    Procedures Procedures (including critical care time)  Medications Ordered in ED Medications  sodium chloride  0.9 % bolus 1,000 mL (0 mLs Intravenous Stopped 02/18/19 0927)  ketorolac (TORADOL) 30 MG/ML injection 30 mg (30 mg Intravenous Given 02/18/19 0851)  ondansetron (ZOFRAN) injection 4 mg (4 mg Intravenous Given 02/18/19 0852)  morphine 4 MG/ML injection 4 mg (4 mg Intravenous Given 02/18/19 0934)  morphine 4 MG/ML injection 4 mg (4 mg Intravenous Given 02/18/19 1038)    ED Course  I have reviewed the triage vital signs and the nursing notes.  Pertinent labs & imaging results that were available during my care of the patient were reviewed by me and considered in my medical decision making (see chart for details).    MDM Rules/Calculators/A&P                      Pt's CT scan looks like a stone has passed through.  Her pain has improved.  Pt said the only thing she's done out of the ordinary is drinking "Skinny fit."  Ingredients are collagens and other natural supplements.  It is unclear if any of these ingredients cause kidney stones, but she is told to avoid it in the future.  Pt is stable for d/c.  Return if worse.  Final Clinical Impression(s) / ED Diagnoses Final diagnoses:  Renal colic on left side    Rx / DC Orders ED Discharge Orders         Ordered    oxyCODONE-acetaminophen (PERCOCET/ROXICET) 5-325 MG tablet  Every 6 hours PRN     02/18/19 1040    ondansetron (ZOFRAN ODT) 4 MG disintegrating tablet  Every 8  hours PRN     02/18/19 1040           Jacalyn Lefevre, MD 02/18/19 1041

## 2019-02-19 LAB — URINE CULTURE

## 2019-03-25 ENCOUNTER — Other Ambulatory Visit: Payer: Self-pay

## 2019-03-25 ENCOUNTER — Encounter (HOSPITAL_COMMUNITY): Payer: Self-pay

## 2019-03-25 ENCOUNTER — Emergency Department (HOSPITAL_COMMUNITY)
Admission: EM | Admit: 2019-03-25 | Discharge: 2019-03-25 | Disposition: A | Discharge status: Home / Self Care | Payer: BC Managed Care – PPO | Attending: Emergency Medicine | Admitting: Emergency Medicine

## 2019-03-25 DIAGNOSIS — S161XXA Strain of muscle, fascia and tendon at neck level, initial encounter: Secondary | ICD-10-CM

## 2019-03-25 DIAGNOSIS — S8002XA Contusion of left knee, initial encounter: Secondary | ICD-10-CM | POA: Diagnosis not present

## 2019-03-25 DIAGNOSIS — Y999 Unspecified external cause status: Secondary | ICD-10-CM | POA: Insufficient documentation

## 2019-03-25 DIAGNOSIS — F17210 Nicotine dependence, cigarettes, uncomplicated: Secondary | ICD-10-CM | POA: Diagnosis not present

## 2019-03-25 DIAGNOSIS — Y92410 Unspecified street and highway as the place of occurrence of the external cause: Secondary | ICD-10-CM | POA: Insufficient documentation

## 2019-03-25 DIAGNOSIS — Z79899 Other long term (current) drug therapy: Secondary | ICD-10-CM | POA: Insufficient documentation

## 2019-03-25 DIAGNOSIS — Y9389 Activity, other specified: Secondary | ICD-10-CM | POA: Insufficient documentation

## 2019-03-25 DIAGNOSIS — S199XXA Unspecified injury of neck, initial encounter: Secondary | ICD-10-CM | POA: Diagnosis present

## 2019-03-25 MED ORDER — OXYCODONE-ACETAMINOPHEN 5-325 MG PO TABS
2.0000 | ORAL_TABLET | Freq: Once | ORAL | Status: AC
  Administered 2019-03-25: 22:00:00 2 via ORAL
  Filled 2019-03-25: qty 2
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
## 2019-04-02 ENCOUNTER — Encounter: Payer: Self-pay | Admitting: Emergency Medicine

## 2019-04-02 ENCOUNTER — Telehealth: Payer: Self-pay | Admitting: Emergency Medicine

## 2019-04-02 ENCOUNTER — Ambulatory Visit
Admission: EM | Admit: 2019-04-02 | Discharge: 2019-04-02 | Disposition: A | Discharge status: Home / Self Care | Payer: BC Managed Care – PPO | Attending: Internal Medicine | Admitting: Internal Medicine

## 2019-04-02 ENCOUNTER — Other Ambulatory Visit: Payer: Self-pay

## 2019-04-02 DIAGNOSIS — L723 Sebaceous cyst: Secondary | ICD-10-CM | POA: Diagnosis not present

## 2019-04-02 DIAGNOSIS — L089 Local infection of the skin and subcutaneous tissue, unspecified: Secondary | ICD-10-CM

## 2019-04-02 MED ORDER — DOXYCYCLINE HYCLATE 100 MG PO CAPS: 100.0000 mg | ORAL_CAPSULE | Freq: Two times a day (BID) | ORAL | 0 refills | Status: AC

## 2019-04-02 MED ORDER — DOXYCYCLINE HYCLATE 100 MG PO CAPS: 100.0000 mg | ORAL_CAPSULE | Freq: Two times a day (BID) | ORAL | 0 refills | Status: DC

## 2019-04-02 NOTE — ED Triage Notes (Signed)
Pt presents to [facility] for assessment of bump to armpit x 2 days.  States it worsened significantly this evening.

## 2019-04-02 NOTE — ED Notes (Signed)
Patient able to ambulate independently  

## 2019-04-02 NOTE — Discharge Instructions (Signed)
Please begin doxycycline for 10 days  Remove packing in 24-48 hours  Apply warm compresses/hot rags to area with massage to express further drainage especially the first 24-48 hours  Return if symptoms returning or not improving

## 2019-04-03 NOTE — ED Provider Notes (Signed)
[facility]    CSN: [id number] Arrival date & time: 04/02/19  1812      History   Chief Complaint Chief Complaint  Patient presents with  . Abscess    HPI Amber Bright is a 44 y.o. female presenting today for evaluation of an abscess.  Patient notes that over the past 2 to 3 days she has developed an area of swelling to her right axilla.  This area has become increasingly painful and irritated over the past day.  She denies any spontaneous drainage.  Denies fevers dizziness or lightheadedness.  HPI  Past Medical History:  Diagnosis Date  . Anxiety   . Depression     There are no problems to display for this patient.   Past Surgical History:  Procedure Laterality Date  . TUBAL LIGATION      OB History   No obstetric history on file.      Home Medications    Prior to Admission medications   Medication Sig Start Date End Date Taking? Authorizing Provider  esomeprazole (NEXIUM) 10 MG packet Take 10 mg by mouth daily before breakfast.   Yes [provider]  methocarbamol (ROBAXIN) 500 MG tablet Take 500 mg by mouth 4 (four) times daily.   Yes [provider]  acetaminophen (TYLENOL) 650 MG CR tablet Take 1,300 mg by mouth every 8 (eight) hours as needed for pain.    [provider]  doxycycline (VIBRAMYCIN) 100 MG capsule Take 1 capsule (100 mg total) by mouth 2 (two) times daily for 10 days. 04/02/19 04/12/19  Donnamaria Shands C, PA-C  hydrochlorothiazide (HYDRODIURIL) 12.5 MG tablet Take 1 tablet (12.5 mg total) by mouth daily. 10/03/15   Gwyneth Sprout, MD  hydrOXYzine (ATARAX/VISTARIL) 25 MG tablet Take 1 tablet (25 mg total) by mouth every 8 (eight) hours as needed for anxiety. 06/16/18   Dicky Doe, MD  Ibuprofen-Diphenhydramine Cit (IBUPROFEN PM) 200-38 MG TABS Take 2 tablets by mouth at bedtime as needed (for sleep/pain).    [provider]  meloxicam (MOBIC) 15 MG tablet Take 15 mg by mouth daily as needed for  pain. 01/24/19   [provider]  naproxen (NAPROSYN) 500 MG tablet Take 1 tablet (500 mg total) by mouth 2 (two) times daily. Patient not taking: Reported on 10/03/2015 06/02/15   Sam, Ace Gins, PA-C  ondansetron (ZOFRAN ODT) 4 MG disintegrating tablet Take 1 tablet (4 mg total) by mouth every 8 (eight) hours as needed. 02/18/19   Jacalyn Lefevre, MD  oxyCODONE-acetaminophen (PERCOCET/ROXICET) 5-325 MG tablet Take 1 tablet by mouth every 6 (six) hours as needed for severe pain. 02/18/19   Jacalyn Lefevre, MD    Family History Family History  Problem Relation Age of Onset  . Hypertension Mother     Social History Social History   Tobacco Use  . Smoking status: Current Every Day Smoker    Packs/day: 0.50    Types: Cigarettes  . Smokeless tobacco: Never Used  Substance Use Topics  . Alcohol use: Yes    Comment: occ  . Drug use: No     Allergies   Hydrocodone-acetaminophen   Review of Systems Review of Systems  Constitutional: Negative for fatigue and fever.  Eyes: Negative for visual disturbance.  Respiratory: Negative for shortness of breath.   Cardiovascular: Negative for chest pain.  Gastrointestinal: Negative for abdominal pain, nausea and vomiting.  Musculoskeletal: Negative for arthralgias and joint swelling.  Skin: Positive for color change. Negative for rash and wound.  Neurological: Negative for dizziness, weakness, light-headedness and headaches.     Physical Exam Triage Vital Signs ED Triage Vitals  Enc Vitals Group     BP 04/02/19 1831 (!) 146/98     Pulse Rate 04/02/19 1831 (!) 106     Resp 04/02/19 1831 18     Temp 04/02/19 1831 97.7 F (36.5 C)     Temp Source 04/02/19 1831 Temporal     SpO2 04/02/19 1831 98 %     Weight --      Height --      Head Circumference --      Peak Flow --      Pain Score 04/02/19 1830 5     Pain Loc --      Pain Edu? --      Excl. in GC? --    No data found.  Updated Vital Signs BP (!) 146/98 (BP Location:  Left Arm)   Pulse (!) 106   Temp 97.7 F (36.5 C) (Temporal)   Resp 18   SpO2 98%   Visual Acuity Right Eye Distance:   Left Eye Distance:   Bilateral Distance:    Right Eye Near:   Left Eye Near:    Bilateral Near:     Physical Exam Vitals and nursing note reviewed.  Constitutional:      Appearance: She is well-developed.     Comments: No acute distress  HENT:     Head: Normocephalic and atraumatic.     Nose: Nose normal.  Eyes:     Conjunctiva/sclera: Conjunctivae normal.  Cardiovascular:     Rate and Rhythm: Normal rate.  Pulmonary:     Effort: Pulmonary effort is normal. No respiratory distress.  Abdominal:     General: There is no distension.  Musculoskeletal:        General: Normal range of motion.     Cervical back: Neck supple.     Comments: Full active range of motion of right shoulder  Skin:    General: Skin is warm and dry.     Comments: Right axilla with 2 cm x 3 cm area of swelling, induration and mild fluctuance, central area appears to be expressing small amount of white material  Neurological:     Mental Status: She is alert and oriented to person, place, and time.      UC Treatments / Results  Labs (all labs ordered are listed, but only abnormal results are displayed) Labs Reviewed - No data to display  EKG   Radiology No results found.  Procedures Incision and Drainage  Date/Time: 04/02/2019 6:42 PM Performed by: Kynadie Yaun, Junius Creamer, PA-C Authorized by: Merrilee Jansky, MD   Consent:    Consent obtained:  Verbal   Consent given by:  Patient   Risks discussed:  Incomplete drainage and pain   Alternatives discussed:  No treatment and alternative treatment Location:    Type:  Abscess   Size:  3 cm   Location:  Upper extremity   Upper extremity location:  Shoulder   Shoulder location:  R shoulder (R axillae) Pre-procedure details:    Skin preparation:  Betadine Anesthesia (see MAR for exact dosages):    Anesthesia method:   Local infiltration   Local anesthetic:  Lidocaine 2% w/o epi Procedure type:    Complexity:  Simple Procedure details:    Incision types:  Single straight   Incision depth:  Subcutaneous   Scalpel blade:  11   Wound management:  Probed  and deloculated   Drainage:  Bloody   Drainage amount:  Scant   Wound treatment:  Wound left open   Packing materials:  1/4 in iodoform gauze Post-procedure details:    Patient tolerance of procedure:  Tolerated well, no immediate complications Comments:     Very small amount of white thicker material drained   (including critical care time)  Medications Ordered in UC Medications - No data to display  Initial Impression / Assessment and Plan / UC Course  I have reviewed the triage vital signs and the nursing notes.  Pertinent labs & imaging results that were available during my care of the patient were reviewed by me and considered in my medical decision making (see chart for details).     Given appearance of thicker material after opening lesion, concerning for possible infected sebaceous cyst versus straightforward abscess.  Packing placed to allow continued drainage over the next 1 to 2 days.  Initiating on doxycycline to further help with infection and inflammation.  Warm compresses, monitor for gradual healing,Discussed strict return precautions. Patient verbalized understanding and is agreeable with plan.    Final Clinical Impressions(s) / UC Diagnoses   Final diagnoses:  Infected sebaceous cyst     Discharge Instructions     Please begin doxycycline for 10 days  Remove packing in 24-48 hours  Apply warm compresses/hot rags to area with massage to express further drainage especially the first 24-48 hours  Return if symptoms returning or not improving   ED Prescriptions    Medication Sig Dispense Auth. Provider   doxycycline (VIBRAMYCIN) 100 MG capsule Take 1 capsule (100 mg total) by mouth 2 (two) times daily for 10 days. 20  capsule Jaymarion Trombly, [place] C, PA-C     PDMP not reviewed this encounter.   Janith Lima, [place] 04/03/19 [phone]

## 2019-04-27 ENCOUNTER — Ambulatory Visit: Payer: Self-pay | Admitting: Internal Medicine

## 2020-07-21 ENCOUNTER — Ambulatory Visit
Admission: EM | Admit: 2020-07-21 | Discharge: 2020-07-21 | Disposition: A | Discharge status: Home / Self Care | Payer: BC Managed Care – PPO | Attending: Internal Medicine | Admitting: Internal Medicine

## 2020-07-21 DIAGNOSIS — L02411 Cutaneous abscess of right axilla: Secondary | ICD-10-CM | POA: Diagnosis not present

## 2020-07-21 IMAGING — CT CT RENAL STONE PROTOCOL
2 of 4 series · 17 of 46 positions shown, 19 images · non-contrast
Comparison: None.

CLINICAL DATA: Left flank pain

EXAM:
CT ABDOMEN AND PELVIS WITHOUT CONTRAST
TECHNIQUE: Multidetector CT imaging of the abdomen and pelvis was performed
following the standard protocol without IV contrast.

[Series 2: axial st · axial · 0.75mm/px · z∈[-464,-94]mm · 14 of 82 slices shown, 16 images]
[im 4/82  soft-tissue]
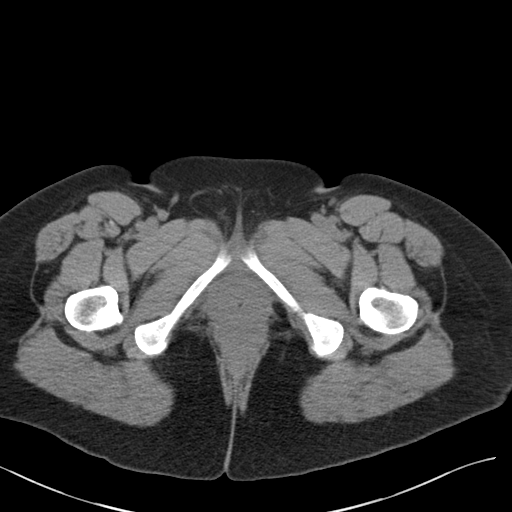
[im 4/82  bone]
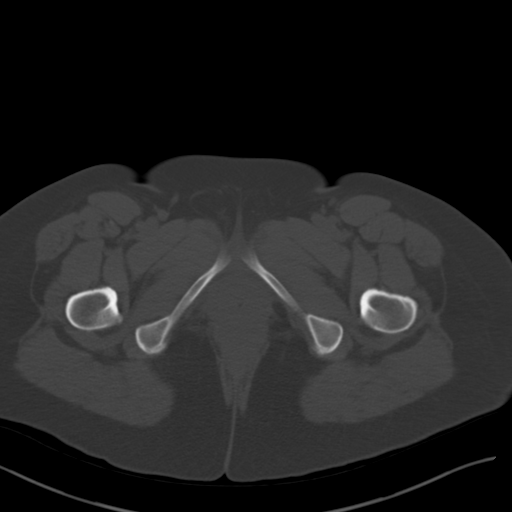
[im 12/82  soft-tissue]
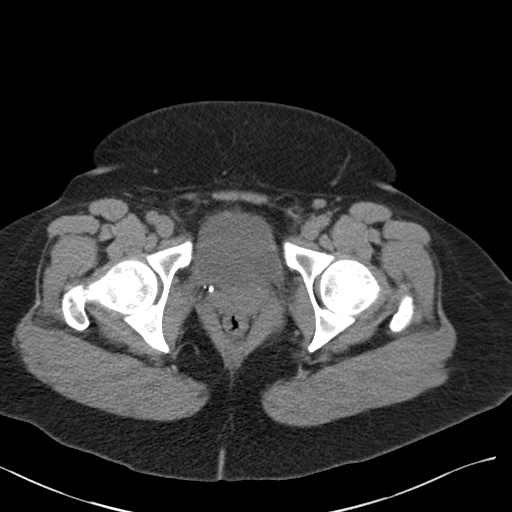
[im 16/82  soft-tissue]
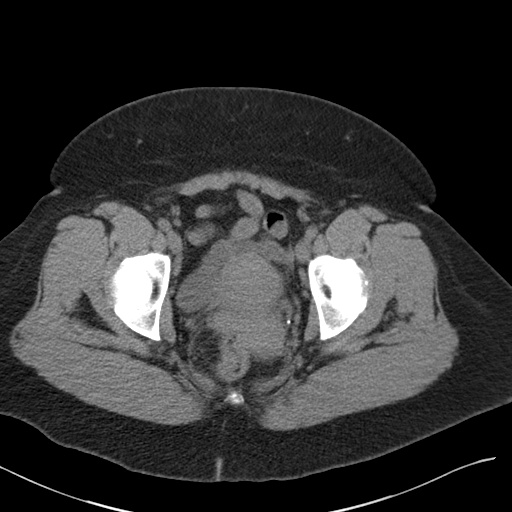
[im 24/82  soft-tissue]
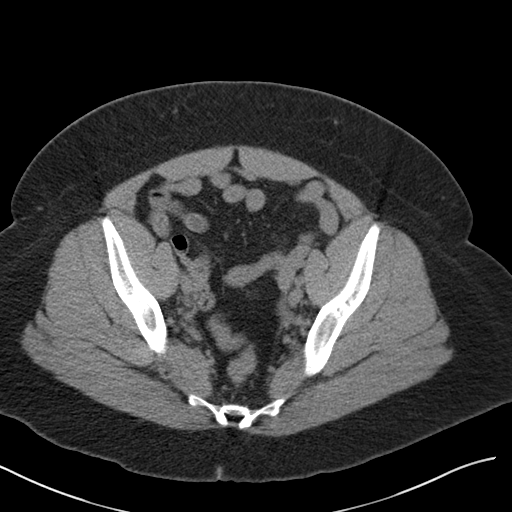
[im 28/82  soft-tissue]
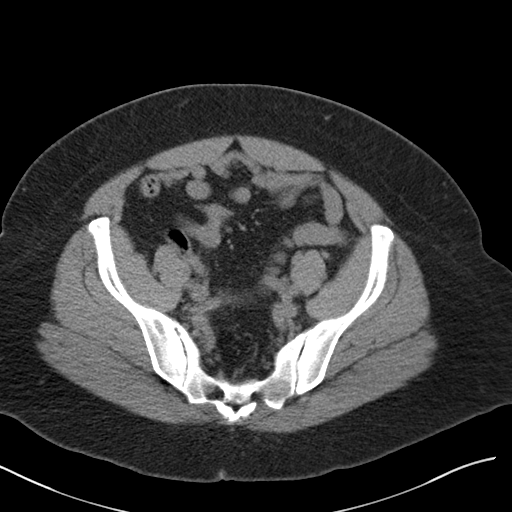
[im 31/82  soft-tissue]
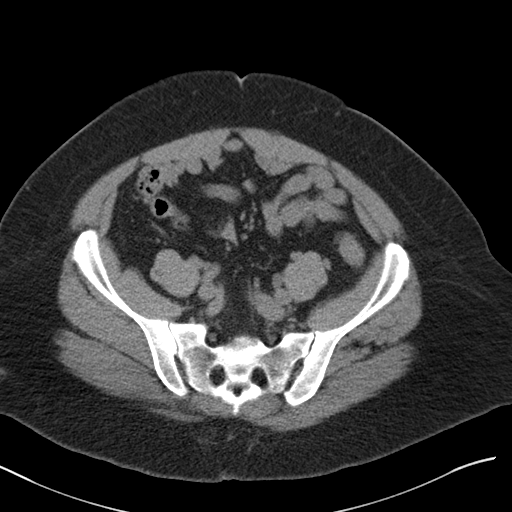
[im 39/82  soft-tissue]
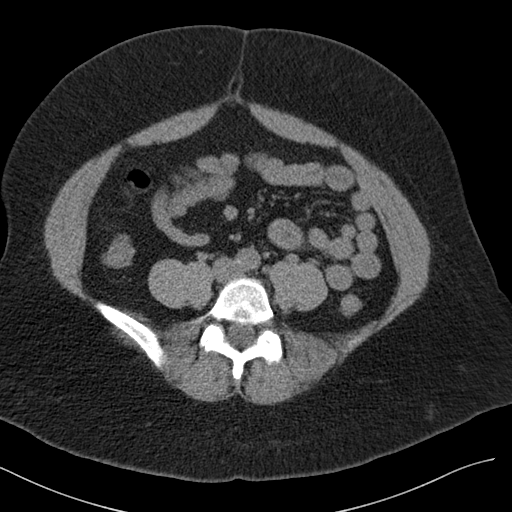
[im 43/82  soft-tissue]
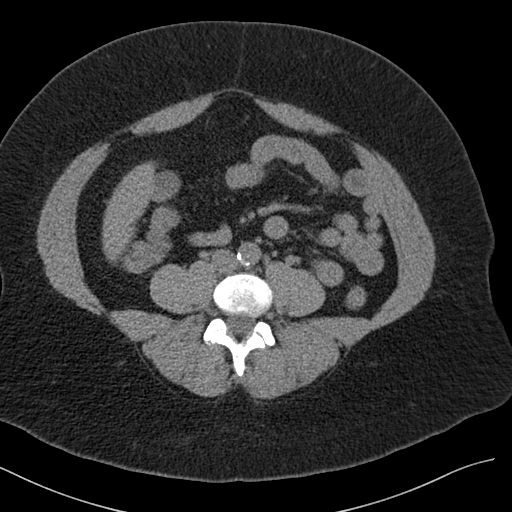
[im 51/82  soft-tissue]
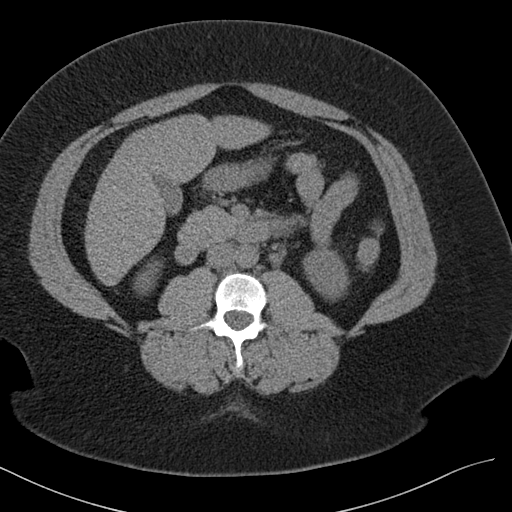
[im 51/82  bone]
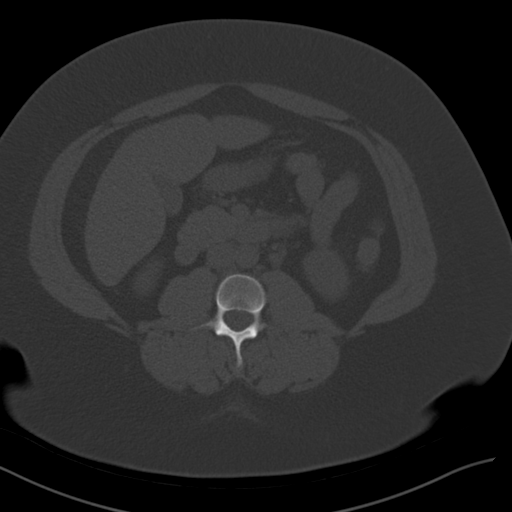
[im 55/82  soft-tissue]
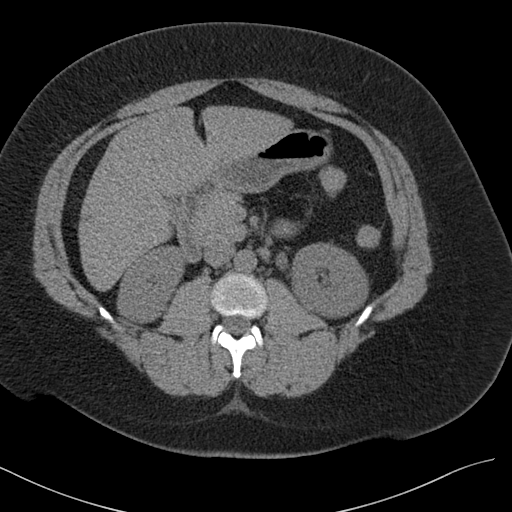
[im 62/82  soft-tissue]
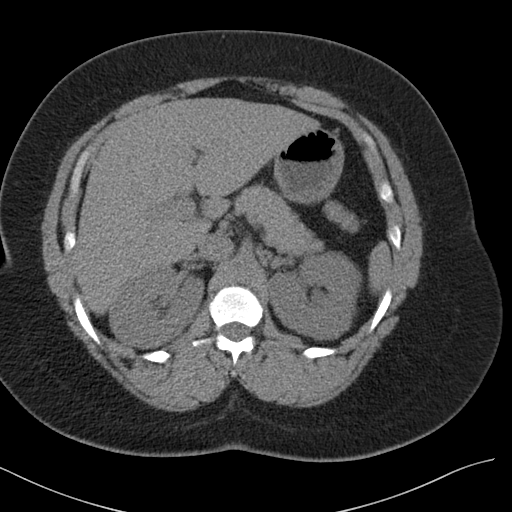
[im 66/82  soft-tissue]
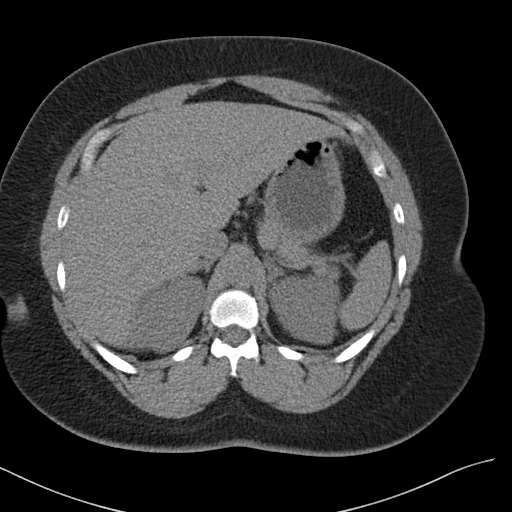
[im 70/82  soft-tissue]
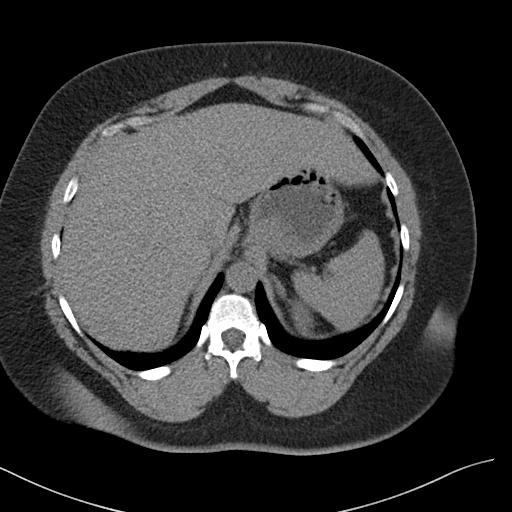
[im 78/82  soft-tissue]
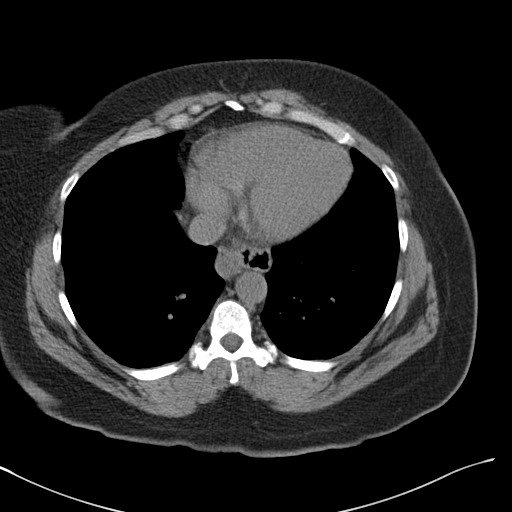

[Series 4: coronal · coronal · 0.70mm/px · 3 of 157 slices shown]
[im 53/157  soft-tissue]
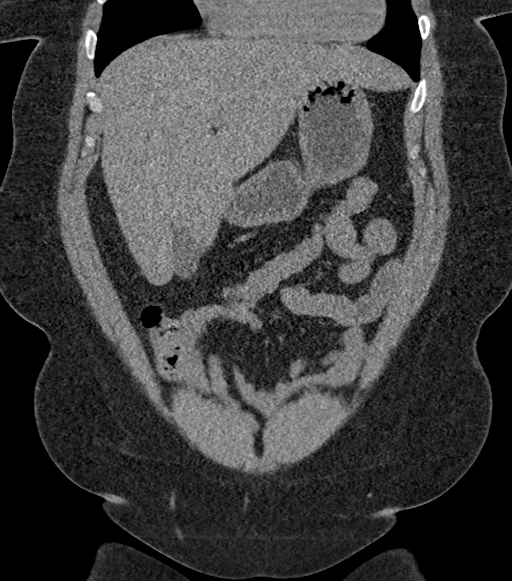
[im 70/157  soft-tissue]
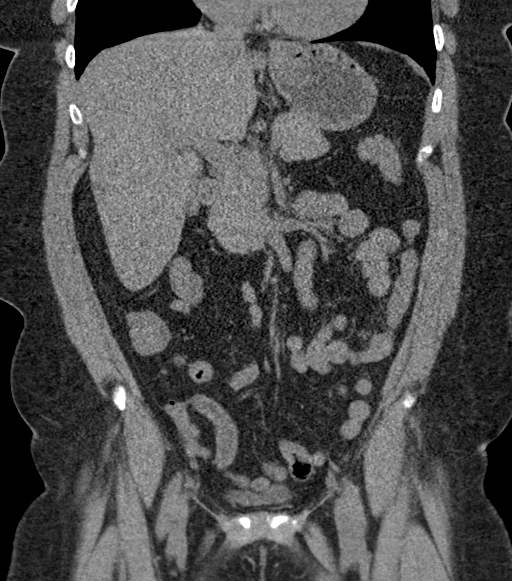
[im 87/157  soft-tissue]
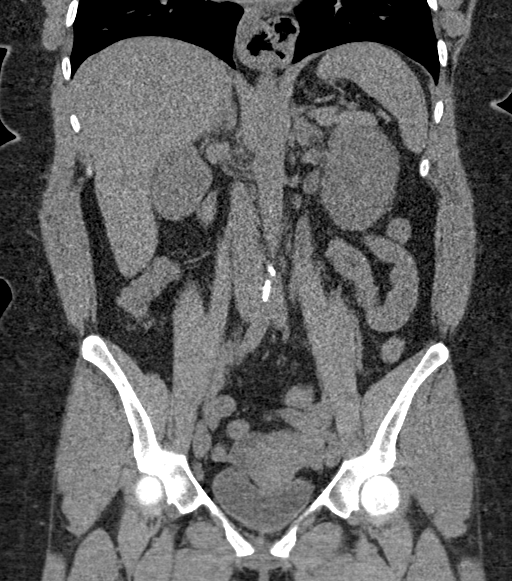

[17 of 46 positions shown; findings below may reference images not displayed]

FINDINGS: Lower chest: No acute abnormality.

Hepatobiliary: No focal liver abnormality is seen. No gallstones,
gallbladder wall thickening, or biliary dilatation.

Pancreas: Unremarkable.

Spleen: Unremarkable.

Adrenals/Urinary Tract: Adrenals are unremarkable. Mild prominence
of the left renal collecting system and proximal ureter. There are
no renal calculi. No definite ureteral calculus. There are multiple
phleboliths in the pelvis. Partially distended bladder is
unremarkable.

Stomach/Bowel: Moderate hiatal hernia. Bowel is normal in caliber
and decompressed. Appendix is not definitely identified.

Vascular/Lymphatic: Mild aortic atherosclerosis.  No adenopathy.

Reproductive: Uterus and bilateral adnexa are unremarkable.

Other: No abdominopelvic ascites.

Musculoskeletal: No acute or significant osseous findings.
IMPRESSION: Mild prominence of the left renal collecting system and proximal
ureter. However, there are no renal or definite ureteral calculi
identified.

## 2020-07-21 MED ORDER — CLINDAMYCIN HCL 150 MG PO CAPS: 300.0000 mg | ORAL_CAPSULE | Freq: Three times a day (TID) | ORAL | 0 refills | Status: AC

## 2020-07-21 NOTE — ED Provider Notes (Signed)
[facility]    CSN: [id number] Arrival date & time: 07/21/20  1949      History   Chief Complaint Chief Complaint  Patient presents with   Abscess    Right underarm    HPI Amber Bright is a 45 y.o. female.   Patient presents with 2-day history of right axilla abscess.  Patient states that the abscess has been draining on its own and she has also been squeezing with some purulent drainage.  Denies fever.  Patient is currently taking amoxicillin due to dental abscess.   Abscess  Past Medical History:  Diagnosis Date   Anxiety    Depression     There are no problems to display for this patient.   Past Surgical History:  Procedure Laterality Date   TUBAL LIGATION      OB History   No obstetric history on file.      Home Medications    Prior to Admission medications   Medication Sig Start Date End Date Taking? Authorizing Provider  clindamycin (CLEOCIN) 150 MG capsule Take 2 capsules (300 mg total) by mouth 3 (three) times daily for 7 days. 07/21/20 07/28/20 Yes Lance Muss, FNP  acetaminophen (TYLENOL) 650 MG CR tablet Take 1,300 mg by mouth every 8 (eight) hours as needed for pain.    [provider]  esomeprazole (NEXIUM) 10 MG packet Take 10 mg by mouth daily before breakfast.    [provider]  hydrochlorothiazide (HYDRODIURIL) 12.5 MG tablet Take 1 tablet (12.5 mg total) by mouth daily. 10/03/15   Gwyneth Sprout, MD  hydrOXYzine (ATARAX/VISTARIL) 25 MG tablet Take 1 tablet (25 mg total) by mouth every 8 (eight) hours as needed for anxiety. 06/16/18   Dicky Doe, MD  Ibuprofen-Diphenhydramine Cit (IBUPROFEN PM) 200-38 MG TABS Take 2 tablets by mouth at bedtime as needed (for sleep/pain).    [provider]  meloxicam (MOBIC) 15 MG tablet Take 15 mg by mouth daily as needed for pain. 01/24/19   [provider]  methocarbamol (ROBAXIN) 500 MG tablet Take 500 mg by mouth 4 (four) times daily.    [provider]  naproxen (NAPROSYN) 500 MG tablet Take 1 tablet (500 mg total) by mouth 2 (two) times daily. Patient not taking: Reported on 10/03/2015 06/02/15   Sam, Ace Gins, PA-C  ondansetron (ZOFRAN ODT) 4 MG disintegrating tablet Take 1 tablet (4 mg total) by mouth every 8 (eight) hours as needed. 02/18/19   Jacalyn Lefevre, MD  oxyCODONE-acetaminophen (PERCOCET/ROXICET) 5-325 MG tablet Take 1 tablet by mouth every 6 (six) hours as needed for severe pain. 02/18/19   Jacalyn Lefevre, MD    Family History Family History  Problem Relation Age of Onset   Hypertension Mother     Social History Social History   Tobacco Use   Smoking status: Every Day    Packs/day: 0.50    Pack years: 0.00    Types: Cigarettes   Smokeless tobacco: Never  Vaping Use   Vaping Use: Never used  Substance Use Topics   Alcohol use: Yes    Comment: occ   Drug use: No     Allergies   Hydrocodone-acetaminophen   Review of Systems Review of Systems Per HPI  Physical Exam Triage Vital Signs ED Triage Vitals  Enc Vitals Group     BP 07/21/20 2032 (!) 166/81     Pulse Rate 07/21/20 2032 95     Resp 07/21/20 2032 18  Temp 07/21/20 2032 97.7 F (36.5 C)     Temp Source 07/21/20 2032 Oral     SpO2 07/21/20 2032 96 %     Weight --      Height --      Head Circumference --      Peak Flow --      Pain Score 07/21/20 2034 10     Pain Loc --      Pain Edu? --      Excl. in GC? --    No data found.  Updated Vital Signs BP (!) 166/81 (BP Location: Right Arm)   Pulse 95   Temp 97.7 F (36.5 C) (Oral)   Resp 18   SpO2 96%   Visual Acuity Right Eye Distance:   Left Eye Distance:   Bilateral Distance:    Right Eye Near:   Left Eye Near:    Bilateral Near:     Physical Exam Constitutional:      Appearance: Normal appearance.  HENT:     Head: Normocephalic and atraumatic.  Eyes:     Extraocular Movements: Extraocular movements intact.     Conjunctiva/sclera: Conjunctivae normal.   Pulmonary:     Effort: Pulmonary effort is normal.  Skin:    General: Skin is warm and dry.     Findings: Abscess present.     Comments: Approximately 2 cm x 1 cm abscess located to right axilla.  Abscess currently has purulent drainage coming from center.  Neurological:     General: No focal deficit present.     Mental Status: She is alert and oriented to person, place, and time. Mental status is at baseline.  Psychiatric:        Mood and Affect: Mood normal.        Behavior: Behavior normal.        Thought Content: Thought content normal.        Judgment: Judgment normal.     UC Treatments / Results  Labs (all labs ordered are listed, but only abnormal results are displayed) Labs Reviewed - No data to display  EKG   Radiology No results found.  Procedures Incision and Drainage  Date/Time: 07/21/2020 9:21 PM Performed by: Lance Muss, FNP Authorized by: Lance Muss, FNP   Consent:    Consent obtained:  Verbal   Consent given by:  Patient   Risks, benefits, and alternatives were discussed: yes     Risks discussed:  Bleeding, incomplete drainage and pain   Alternatives discussed:  No treatment and alternative treatment Universal protocol:    Procedure explained and questions answered to patient or proxy's satisfaction: yes     Relevant documents present and verified: yes     Test results available : no (N/A)     Imaging studies available: no (N/A)     Required blood products, implants, devices, and special equipment available: yes     Site/side marked: yes     Immediately prior to procedure, a time out was called: yes     Patient identity confirmed:  Verbally with patient and arm band Location:    Type:  Abscess   Size:  2 cm by 1 cm   Location:  Upper extremity   Upper extremity location: Axilla. Pre-procedure details:    Skin preparation:  Chlorhexidine with alcohol and povidone-iodine Sedation:    Sedation type:  None Anesthesia:    Anesthesia  method:  Local infiltration   Local anesthetic:  Lidocaine 1% w/o  epi Procedure type:    Complexity:  Simple Procedure details:    Ultrasound guidance: no     Needle aspiration: no     Incision types:  Single straight   Incision depth:  Subcutaneous   Wound management:  Probed and deloculated   Drainage:  Purulent   Drainage amount:  Scant   Wound treatment:  Wound left open   Packing materials:  None Post-procedure details:    Procedure completion:  Tolerated well, no immediate complications (including critical care time)  Medications Ordered in UC Medications - No data to display  Initial Impression / Assessment and Plan / UC Course  I have reviewed the triage vital signs and the nursing notes.  Pertinent labs & imaging results that were available during my care of the patient were reviewed by me and considered in my medical decision making (see chart for details).     Drainage of abscess completed with small amount of purulent drainage as a result.  See procedure note. abscess does not need packing.  Advised patient to apply warm compresses.  Stop amoxicillin.  Start clindamycin to cover for dental abscess and cutaneous abscess treatment.  Patient to follow-up if abscess does not improve or worsens.  Monitor for fevers. discussed strict return precautions. Patient verbalized understanding and is agreeable with plan.  Final Clinical Impressions(s) / UC Diagnoses   Final diagnoses:  Cutaneous abscess of right axilla     Discharge Instructions      Please stop amoxicillin that was prescribed by dentist.  Please start clindamycin antibiotic that will treat abscess as well as dental infection.  Please take with food to avoid nausea or diarrhea.  Please use warm compresses to affected area.  Monitor for fever.  Please follow-up if abscess worsens or changes in any way.     ED Prescriptions     Medication Sig Dispense Auth. Provider   clindamycin (CLEOCIN) 150 MG capsule  Take 2 capsules (300 mg total) by mouth 3 (three) times daily for 7 days. 42 capsule Lance Muss, FNP      PDMP not reviewed this encounter.   Lance Muss, FNP 07/21/20 2127

## 2020-07-21 NOTE — Discharge Instructions (Addendum)
Please stop amoxicillin that was prescribed by dentist.  Please start clindamycin antibiotic that will treat abscess as well as dental infection.  Please take with food to avoid nausea or diarrhea.  Please use warm compresses to affected area.  Monitor for fever.  Please follow-up if abscess worsens or changes in any way.

## 2020-07-21 NOTE — ED Triage Notes (Signed)
Two day h/o abscess on her right arm pit after starting Amoxicillin for impacted wisdom tooth. Pt notes painful, hard, indurated lesion. She has tried to squeeze the lesion and notes some drainage. Pain continues to worsen with a burning sensation. Currently taking ibuprofen with minimal relief.
